# Patient Record
Sex: Female | Born: 1965 | Race: Black or African American | Hispanic: No | Smoking: Never smoker
Health system: Southern US, Community
[De-identification: ages and names within clinical notes are randomized; demographics above are authoritative.]

## PROBLEM LIST (undated history)

## (undated) DIAGNOSIS — I10 Essential (primary) hypertension: Secondary | ICD-10-CM

## (undated) DIAGNOSIS — K219 Gastro-esophageal reflux disease without esophagitis: Secondary | ICD-10-CM

## (undated) DIAGNOSIS — L309 Dermatitis, unspecified: Secondary | ICD-10-CM

## (undated) DIAGNOSIS — E7800 Pure hypercholesterolemia, unspecified: Secondary | ICD-10-CM

## (undated) HISTORY — PX: LITHOTRIPSY: SUR834

## (undated) HISTORY — DX: Essential (primary) hypertension: I10

## (undated) HISTORY — PX: TUBAL LIGATION: SHX77

## (undated) HISTORY — PX: UTERINE FIBROID EMBOLIZATION: SHX825

## (undated) HISTORY — PX: DILATION AND CURETTAGE OF UTERUS: SHX78

---

## 1997-11-29 ENCOUNTER — Emergency Department (HOSPITAL_COMMUNITY): Admission: EM | Admit: 1997-11-29 | Discharge: 1997-11-29 | Payer: Self-pay | Admitting: Family Medicine

## 2000-09-16 ENCOUNTER — Emergency Department (HOSPITAL_COMMUNITY): Admission: EM | Admit: 2000-09-16 | Discharge: 2000-09-16 | Payer: Self-pay | Admitting: *Deleted

## 2002-02-06 ENCOUNTER — Encounter: Payer: Self-pay | Admitting: Family Medicine

## 2002-02-06 ENCOUNTER — Ambulatory Visit (HOSPITAL_COMMUNITY): Admission: RE | Admit: 2002-02-06 | Discharge: 2002-02-06 | Payer: Self-pay | Admitting: Family Medicine

## 2002-03-08 ENCOUNTER — Ambulatory Visit (HOSPITAL_COMMUNITY): Admission: RE | Admit: 2002-03-08 | Discharge: 2002-03-08 | Payer: Self-pay | Admitting: Obstetrics and Gynecology

## 2002-03-08 ENCOUNTER — Encounter: Payer: Self-pay | Admitting: Obstetrics and Gynecology

## 2002-03-21 ENCOUNTER — Ambulatory Visit (HOSPITAL_COMMUNITY): Admission: RE | Admit: 2002-03-21 | Discharge: 2002-03-21 | Payer: Self-pay | Admitting: Interventional Radiology

## 2002-04-09 ENCOUNTER — Observation Stay (HOSPITAL_COMMUNITY): Admission: RE | Admit: 2002-04-09 | Discharge: 2002-04-10 | Payer: Self-pay | Admitting: Interventional Radiology

## 2002-10-13 ENCOUNTER — Emergency Department (HOSPITAL_COMMUNITY): Admission: EM | Admit: 2002-10-13 | Discharge: 2002-10-13 | Payer: Self-pay | Admitting: Emergency Medicine

## 2002-10-13 ENCOUNTER — Encounter: Payer: Self-pay | Admitting: Emergency Medicine

## 2003-08-13 ENCOUNTER — Ambulatory Visit (HOSPITAL_COMMUNITY): Admission: RE | Admit: 2003-08-13 | Discharge: 2003-08-13 | Payer: Self-pay | Admitting: Family Medicine

## 2003-08-16 ENCOUNTER — Encounter: Admission: RE | Admit: 2003-08-16 | Discharge: 2003-08-16 | Payer: Self-pay | Admitting: Family Medicine

## 2004-02-03 ENCOUNTER — Ambulatory Visit: Payer: Self-pay | Admitting: *Deleted

## 2004-03-10 ENCOUNTER — Ambulatory Visit: Payer: Self-pay | Admitting: Family Medicine

## 2004-08-20 ENCOUNTER — Ambulatory Visit: Payer: Self-pay | Admitting: Family Medicine

## 2004-10-14 ENCOUNTER — Ambulatory Visit (HOSPITAL_COMMUNITY): Admission: RE | Admit: 2004-10-14 | Discharge: 2004-10-14 | Payer: Self-pay | Admitting: Family Medicine

## 2005-02-17 DIAGNOSIS — E785 Hyperlipidemia, unspecified: Secondary | ICD-10-CM | POA: Insufficient documentation

## 2005-02-26 ENCOUNTER — Ambulatory Visit: Payer: Self-pay | Admitting: Family Medicine

## 2005-06-18 ENCOUNTER — Ambulatory Visit: Payer: Self-pay | Admitting: Family Medicine

## 2005-07-30 ENCOUNTER — Ambulatory Visit: Payer: Self-pay | Admitting: Family Medicine

## 2005-11-26 ENCOUNTER — Ambulatory Visit: Payer: Self-pay | Admitting: Family Medicine

## 2005-12-23 ENCOUNTER — Ambulatory Visit: Payer: Self-pay | Admitting: Family Medicine

## 2006-01-05 ENCOUNTER — Ambulatory Visit (HOSPITAL_COMMUNITY): Admission: RE | Admit: 2006-01-05 | Discharge: 2006-01-05 | Payer: Self-pay | Admitting: Family Medicine

## 2006-03-28 ENCOUNTER — Ambulatory Visit: Payer: Self-pay | Admitting: Family Medicine

## 2006-07-22 ENCOUNTER — Ambulatory Visit: Payer: Self-pay | Admitting: Family Medicine

## 2006-07-23 ENCOUNTER — Encounter (INDEPENDENT_AMBULATORY_CARE_PROVIDER_SITE_OTHER): Payer: Self-pay | Admitting: Family Medicine

## 2006-07-23 LAB — CONVERTED CEMR LAB
ALT: 16 units/L
AST: 15 units/L
BUN: 13 mg/dL
Cholesterol: 118 mg/dL
Creatinine, Ser: 0.92 mg/dL
Triglycerides: 223 mg/dL

## 2006-08-16 ENCOUNTER — Encounter (INDEPENDENT_AMBULATORY_CARE_PROVIDER_SITE_OTHER): Payer: Self-pay | Admitting: Family Medicine

## 2006-08-16 ENCOUNTER — Ambulatory Visit: Payer: Self-pay | Admitting: Family Medicine

## 2006-08-16 LAB — CONVERTED CEMR LAB: Pap Smear: NORMAL

## 2006-09-03 ENCOUNTER — Encounter (INDEPENDENT_AMBULATORY_CARE_PROVIDER_SITE_OTHER): Payer: Self-pay | Admitting: Family Medicine

## 2006-09-03 DIAGNOSIS — E119 Type 2 diabetes mellitus without complications: Secondary | ICD-10-CM | POA: Insufficient documentation

## 2006-09-03 DIAGNOSIS — R809 Proteinuria, unspecified: Secondary | ICD-10-CM | POA: Insufficient documentation

## 2006-09-03 DIAGNOSIS — I10 Essential (primary) hypertension: Secondary | ICD-10-CM | POA: Insufficient documentation

## 2006-11-08 ENCOUNTER — Ambulatory Visit: Payer: Self-pay | Admitting: Family Medicine

## 2006-12-02 ENCOUNTER — Emergency Department (HOSPITAL_COMMUNITY): Admission: EM | Admit: 2006-12-02 | Discharge: 2006-12-02 | Payer: Self-pay | Admitting: Emergency Medicine

## 2006-12-20 ENCOUNTER — Ambulatory Visit: Payer: Self-pay | Admitting: Family Medicine

## 2006-12-20 LAB — CONVERTED CEMR LAB: Blood Glucose, Fingerstick: 181

## 2007-01-04 ENCOUNTER — Encounter (INDEPENDENT_AMBULATORY_CARE_PROVIDER_SITE_OTHER): Payer: Self-pay | Admitting: *Deleted

## 2007-01-10 ENCOUNTER — Telehealth (INDEPENDENT_AMBULATORY_CARE_PROVIDER_SITE_OTHER): Payer: Self-pay | Admitting: Internal Medicine

## 2007-01-30 ENCOUNTER — Ambulatory Visit (HOSPITAL_COMMUNITY): Admission: RE | Admit: 2007-01-30 | Discharge: 2007-01-30 | Payer: Self-pay | Admitting: Family Medicine

## 2007-02-14 ENCOUNTER — Ambulatory Visit: Payer: Self-pay | Admitting: Family Medicine

## 2007-02-14 LAB — CONVERTED CEMR LAB
Alkaline Phosphatase: 55 units/L (ref 39–117)
Glucose, Bld: 124 mg/dL — ABNORMAL HIGH (ref 70–99)
LDL Cholesterol: 30 mg/dL (ref 0–99)
Sodium: 138 meq/L (ref 135–145)
Total Bilirubin: 0.5 mg/dL (ref 0.3–1.2)
Total Protein: 7.1 g/dL (ref 6.0–8.3)
Triglycerides: 301 mg/dL — ABNORMAL HIGH (ref <150)
VLDL: 60 mg/dL — ABNORMAL HIGH (ref 0–40)

## 2007-05-18 ENCOUNTER — Telehealth (INDEPENDENT_AMBULATORY_CARE_PROVIDER_SITE_OTHER): Payer: Self-pay | Admitting: Family Medicine

## 2007-07-12 ENCOUNTER — Telehealth (INDEPENDENT_AMBULATORY_CARE_PROVIDER_SITE_OTHER): Payer: Self-pay | Admitting: *Deleted

## 2007-07-26 ENCOUNTER — Ambulatory Visit: Payer: Self-pay | Admitting: Family Medicine

## 2007-07-26 LAB — CONVERTED CEMR LAB
ALT: 13 units/L (ref 0–35)
AST: 11 units/L (ref 0–37)
Calcium: 9.2 mg/dL (ref 8.4–10.5)
Chloride: 104 meq/L (ref 96–112)
Creatinine, Ser: 0.97 mg/dL (ref 0.40–1.20)
LDL Cholesterol: 61 mg/dL (ref 0–99)
Total Bilirubin: 0.4 mg/dL (ref 0.3–1.2)
Total CHOL/HDL Ratio: 3.4
VLDL: 24 mg/dL (ref 0–40)

## 2007-08-02 ENCOUNTER — Telehealth (INDEPENDENT_AMBULATORY_CARE_PROVIDER_SITE_OTHER): Payer: Self-pay | Admitting: Family Medicine

## 2007-08-03 ENCOUNTER — Emergency Department (HOSPITAL_COMMUNITY): Admission: EM | Admit: 2007-08-03 | Discharge: 2007-08-03 | Payer: Self-pay | Admitting: Emergency Medicine

## 2007-08-22 ENCOUNTER — Ambulatory Visit: Payer: Self-pay | Admitting: Family Medicine

## 2007-08-22 LAB — CONVERTED CEMR LAB
Blood Glucose, Fingerstick: 109
Hgb A1c MFr Bld: 6.9 %

## 2008-02-01 ENCOUNTER — Ambulatory Visit (HOSPITAL_COMMUNITY): Admission: RE | Admit: 2008-02-01 | Discharge: 2008-02-01 | Payer: Self-pay | Admitting: Family Medicine

## 2008-02-12 ENCOUNTER — Encounter (INDEPENDENT_AMBULATORY_CARE_PROVIDER_SITE_OTHER): Payer: Self-pay | Admitting: Family Medicine

## 2008-02-13 ENCOUNTER — Encounter (INDEPENDENT_AMBULATORY_CARE_PROVIDER_SITE_OTHER): Payer: Self-pay | Admitting: Family Medicine

## 2008-02-14 ENCOUNTER — Encounter: Admission: RE | Admit: 2008-02-14 | Discharge: 2008-02-14 | Payer: Self-pay | Admitting: Family Medicine

## 2008-04-24 ENCOUNTER — Ambulatory Visit: Payer: Self-pay | Admitting: Family Medicine

## 2008-04-24 ENCOUNTER — Encounter (INDEPENDENT_AMBULATORY_CARE_PROVIDER_SITE_OTHER): Payer: Self-pay | Admitting: Family Medicine

## 2008-04-24 DIAGNOSIS — L259 Unspecified contact dermatitis, unspecified cause: Secondary | ICD-10-CM | POA: Insufficient documentation

## 2008-04-24 LAB — CONVERTED CEMR LAB
Alkaline Phosphatase: 61 units/L (ref 39–117)
Basophils Absolute: 0 10*3/uL (ref 0.0–0.1)
Blood in Urine, dipstick: NEGATIVE
Creatinine, Ser: 1.01 mg/dL (ref 0.40–1.20)
Eosinophils Absolute: 0.4 10*3/uL (ref 0.0–0.7)
Eosinophils Relative: 7 % — ABNORMAL HIGH (ref 0–5)
Glucose, Bld: 131 mg/dL — ABNORMAL HIGH (ref 70–99)
HCT: 36.4 % (ref 36.0–46.0)
LDL Cholesterol: 70 mg/dL (ref 0–99)
MCV: 97.8 fL (ref 78.0–100.0)
Monocytes Absolute: 0.6 10*3/uL (ref 0.1–1.0)
Nitrite: NEGATIVE
Platelets: 336 10*3/uL (ref 150–400)
Protein, U semiquant: 100
RDW: 15.6 % — ABNORMAL HIGH (ref 11.5–15.5)
Sodium: 140 meq/L (ref 135–145)
Total Bilirubin: 0.6 mg/dL (ref 0.3–1.2)
Total CHOL/HDL Ratio: 4.3
Total Protein: 7 g/dL (ref 6.0–8.3)
Triglycerides: 107 mg/dL (ref <150)
Urobilinogen, UA: 1
VLDL: 21 mg/dL (ref 0–40)
WBC Urine, dipstick: NEGATIVE

## 2008-06-11 ENCOUNTER — Encounter (INDEPENDENT_AMBULATORY_CARE_PROVIDER_SITE_OTHER): Payer: Self-pay | Admitting: Family Medicine

## 2009-01-23 ENCOUNTER — Telehealth: Payer: Self-pay | Admitting: Physician Assistant

## 2009-02-14 ENCOUNTER — Ambulatory Visit (HOSPITAL_COMMUNITY): Admission: RE | Admit: 2009-02-14 | Discharge: 2009-02-14 | Payer: Self-pay | Admitting: Internal Medicine

## 2009-03-11 ENCOUNTER — Ambulatory Visit: Payer: Self-pay | Admitting: Physician Assistant

## 2009-03-11 LAB — CONVERTED CEMR LAB
HDL goal, serum: 40 mg/dL
Hgb A1c MFr Bld: 6.7 %

## 2009-03-26 ENCOUNTER — Encounter (INDEPENDENT_AMBULATORY_CARE_PROVIDER_SITE_OTHER): Payer: Self-pay | Admitting: *Deleted

## 2009-03-26 LAB — CONVERTED CEMR LAB
ALT: 12 units/L (ref 0–35)
Albumin: 4.6 g/dL (ref 3.5–5.2)
CO2: 23 meq/L (ref 19–32)
Calcium: 10.1 mg/dL (ref 8.4–10.5)
Chloride: 102 meq/L (ref 96–112)
Cholesterol: 129 mg/dL (ref 0–200)
Glucose, Bld: 123 mg/dL — ABNORMAL HIGH (ref 70–99)
Microalb, Ur: 0.73 mg/dL (ref 0.00–1.89)
Potassium: 4.4 meq/L (ref 3.5–5.3)
Sodium: 140 meq/L (ref 135–145)
Total Bilirubin: 1 mg/dL (ref 0.3–1.2)
Total Protein: 7.9 g/dL (ref 6.0–8.3)
Triglycerides: 159 mg/dL — ABNORMAL HIGH (ref <150)

## 2009-04-02 ENCOUNTER — Telehealth: Payer: Self-pay | Admitting: Physician Assistant

## 2009-08-21 ENCOUNTER — Ambulatory Visit: Payer: Self-pay | Admitting: Physician Assistant

## 2009-08-21 DIAGNOSIS — A630 Anogenital (venereal) warts: Secondary | ICD-10-CM | POA: Insufficient documentation

## 2009-08-21 DIAGNOSIS — F4321 Adjustment disorder with depressed mood: Secondary | ICD-10-CM | POA: Insufficient documentation

## 2009-08-21 LAB — CONVERTED CEMR LAB
Bilirubin Urine: NEGATIVE
Blood Glucose, Fingerstick: 80
Glucose, Urine, Semiquant: NEGATIVE
Hgb A1c MFr Bld: 6.5 %
Ketones, urine, test strip: NEGATIVE
Rapid HIV Screen: NEGATIVE
Specific Gravity, Urine: >=1.03
Urobilinogen, UA: 1
Whiff Test: NEGATIVE
pH: 5

## 2009-08-22 LAB — CONVERTED CEMR LAB
Alkaline Phosphatase: 66 units/L (ref 39–117)
Basophils Absolute: 0 10*3/uL (ref 0.0–0.1)
Basophils Relative: 0 % (ref 0–1)
CO2: 22 meq/L (ref 19–32)
Chlamydia, DNA Probe: NEGATIVE
Cholesterol: 98 mg/dL (ref 0–200)
Creatinine, Ser: 0.93 mg/dL (ref 0.40–1.20)
Eosinophils Absolute: 0.4 10*3/uL (ref 0.0–0.7)
Glucose, Bld: 102 mg/dL — ABNORMAL HIGH (ref 70–99)
HDL: 31 mg/dL — ABNORMAL LOW (ref >39)
LDL Cholesterol: 45 mg/dL (ref 0–99)
MCHC: 31.7 g/dL (ref 30.0–36.0)
MCV: 95.3 fL (ref 78.0–100.0)
Monocytes Absolute: 0.4 10*3/uL (ref 0.1–1.0)
Monocytes Relative: 6 % (ref 3–12)
Neutro Abs: 4.8 10*3/uL (ref 1.7–7.7)
Neutrophils Relative %: 62 % (ref 43–77)
RBC: 3.81 M/uL — ABNORMAL LOW (ref 3.87–5.11)
RDW: 16 % — ABNORMAL HIGH (ref 11.5–15.5)
Total Bilirubin: 0.5 mg/dL (ref 0.3–1.2)
Total CHOL/HDL Ratio: 3.2
Triglycerides: 109 mg/dL (ref <150)
VLDL: 22 mg/dL (ref 0–40)

## 2009-08-25 ENCOUNTER — Encounter: Payer: Self-pay | Admitting: Physician Assistant

## 2009-08-25 ENCOUNTER — Ambulatory Visit: Payer: Self-pay | Admitting: Internal Medicine

## 2009-08-27 ENCOUNTER — Encounter: Payer: Self-pay | Admitting: Physician Assistant

## 2009-08-27 DIAGNOSIS — D649 Anemia, unspecified: Secondary | ICD-10-CM | POA: Insufficient documentation

## 2009-08-27 LAB — CONVERTED CEMR LAB
Ferritin: 99 ng/mL (ref 10–291)
Folate: 7.4 ng/mL
Saturation Ratios: 16 % — ABNORMAL LOW (ref 20–55)
TIBC: 344 ug/dL (ref 250–470)
Vitamin B-12: 637 pg/mL (ref 211–911)

## 2009-08-28 ENCOUNTER — Encounter (INDEPENDENT_AMBULATORY_CARE_PROVIDER_SITE_OTHER): Payer: Self-pay | Admitting: *Deleted

## 2009-09-08 ENCOUNTER — Encounter: Payer: Self-pay | Admitting: Physician Assistant

## 2009-09-17 ENCOUNTER — Encounter (INDEPENDENT_AMBULATORY_CARE_PROVIDER_SITE_OTHER): Payer: Self-pay | Admitting: *Deleted

## 2009-10-14 ENCOUNTER — Ambulatory Visit: Payer: Self-pay | Admitting: Internal Medicine

## 2009-10-14 ENCOUNTER — Encounter: Payer: Self-pay | Admitting: Physician Assistant

## 2009-11-06 ENCOUNTER — Emergency Department (HOSPITAL_COMMUNITY): Admission: EM | Admit: 2009-11-06 | Discharge: 2009-11-06 | Payer: Self-pay | Admitting: Emergency Medicine

## 2009-12-25 ENCOUNTER — Ambulatory Visit: Payer: Self-pay | Admitting: Internal Medicine

## 2009-12-25 ENCOUNTER — Encounter: Payer: Self-pay | Admitting: Physician Assistant

## 2009-12-25 DIAGNOSIS — R609 Edema, unspecified: Secondary | ICD-10-CM | POA: Insufficient documentation

## 2009-12-26 ENCOUNTER — Encounter: Payer: Self-pay | Admitting: Physician Assistant

## 2009-12-26 LAB — CONVERTED CEMR LAB
BUN: 11 mg/dL (ref 6–23)
Basophils Absolute: 0 10*3/uL (ref 0.0–0.1)
Basophils Relative: 0 % (ref 0–1)
Eosinophils Absolute: 0.3 10*3/uL (ref 0.0–0.7)
Hgb A1c MFr Bld: 6.6 % — ABNORMAL HIGH (ref <5.7)
MCHC: 32.3 g/dL (ref 30.0–36.0)
MCV: 93.6 fL (ref 78.0–100.0)
Monocytes Relative: 5 % (ref 3–12)
Neutrophils Relative %: 62 % (ref 43–77)
Platelets: 358 10*3/uL (ref 150–400)
Potassium: 3.9 meq/L (ref 3.5–5.3)
RBC: 4.2 M/uL (ref 3.87–5.11)
RDW: 15.2 % (ref 11.5–15.5)

## 2010-02-17 ENCOUNTER — Ambulatory Visit (HOSPITAL_COMMUNITY)
Admission: RE | Admit: 2010-02-17 | Discharge: 2010-02-17 | Payer: Self-pay | Source: Home / Self Care | Admitting: Internal Medicine

## 2010-02-19 ENCOUNTER — Telehealth (INDEPENDENT_AMBULATORY_CARE_PROVIDER_SITE_OTHER): Payer: Self-pay | Admitting: *Deleted

## 2010-02-23 ENCOUNTER — Encounter: Payer: Self-pay | Admitting: Physician Assistant

## 2010-03-02 ENCOUNTER — Encounter: Admission: RE | Admit: 2010-03-02 | Discharge: 2010-03-02 | Payer: Self-pay | Admitting: Internal Medicine

## 2010-05-05 ENCOUNTER — Ambulatory Visit: Admit: 2010-05-05 | Payer: Self-pay | Admitting: Nurse Practitioner

## 2010-05-09 ENCOUNTER — Encounter: Payer: Self-pay | Admitting: Internal Medicine

## 2010-05-10 ENCOUNTER — Encounter: Payer: Self-pay | Admitting: Family Medicine

## 2010-05-19 NOTE — Letter (Signed)
Summary: *HSN Results Follow up  [facility]  [address] [place], [place] [address]   Phone: [phone]  Fax: [phone]      12/26/2009   [facility] [address] [place], [place]  [address]   Dear  Ms. Jenny Colon,                            ____S.Drinkard,FNP   ____D. Gore,FNP       ____B. McPherson,MD   ____V. Rankins,MD    ____E. Mulberry,MD    ____N. Daphine Deutscher, FNP  ____D. Reche Dixon, MD    ____K. Philipp Deputy, MD    __x__S. Alben Spittle, PA-C     This letter is to inform you that your recent test(s):  _______Pap Smear    ___x____Lab Test     _______X-ray    ___x____ is within acceptable limits  _______ requires a medication change  _______ requires a follow-up lab visit  _______ requires a follow-up visit with your provider   Comments:  Blood counts ok.  Kidney function is good.  Hemoglobin A1C 6.6 which is great!  Keep up the good work.       _________________________________________________________ If you have any questions, please contact our office                     Sincerely,  Tereso Newcomer PA-C [facility]

## 2010-05-19 NOTE — Progress Notes (Signed)
Summary: Letter  Phone Note Outgoing Call   Summary of Call: Form signed for [employer] pt and see if she wants it faxed back or will come pick up Need fax # if she wants if faxted back Initial call taken by: Lehman Prom FNP,  February 19, 2010 5:50 PM  Follow-up for Phone Call        CALLED PT LEFT MESSAGE /LETTER IS READY FOR PICK UP Follow-up by: Arta Bruce,  February 20, 2010 11:17 AM

## 2010-05-19 NOTE — Miscellaneous (Signed)
Summary: Retasure Normal   Clinical Lists Changes  Observations: Added new observation of DIAB EYE EX: Retasure Normal (08/28/2009 22:59)

## 2010-05-19 NOTE — Assessment & Plan Note (Signed)
Summary: SCHE IN 4 MONTHS WITH SCOTT FOR DM AND HYPERTENSION//LR   Vital Signs:  Patient profile:   45 year old female Menstrual status:  regular Height:      62.50 inches Weight:      212.4 pounds Temp:     97.9 degrees F oral Pulse rate:   72 / minute Pulse rhythm:   regular Resp:     16 per minute BP sitting:   106 / 70  (right arm) Cuff size:   large  Vitals Entered By: Michelle Nasuti (December 25, 2009 4:03 PM) CC: dm and htn follow up, Hypertension Management   Primary Care Provider:  Tereso Newcomer PA-C  CC:  dm and htn follow up and Hypertension Management.  History of Present Illness: DM:  Sugars 90-140.  No foot problems.  No polyuria or polydipsia.  No nausea or abd pain.  Has some ? swelling in left leg.  Started when she started exercising a few mos. ago.  No injury. No recent trips.  No cp.  No syncope.  No hemoptysis.  Does not go down when she lies flat.  Does favor left side and pushes more with right when exercising.   Hypertension History:      She denies headache, dyspnea with exertion, and syncope.  She notes no problems with any antihypertensive medication side effects.        Positive major cardiovascular risk factors include diabetes, hyperlipidemia, and hypertension.  Negative major cardiovascular risk factors include female age less than 65 years old, negative family history for ischemic heart disease, and non-tobacco-Rosia Syme status.        Further assessment for target organ damage reveals no history of ASHD, stroke/TIA, or peripheral vascular disease.     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 Pcn  Physical Exam  General:  alert, well-developed, and well-nourished.   Head:  normocephalic and atraumatic.   Eyes:  pupils equal, pupils round, pupils reactive to light, and no optic disk abnormalities.   Neck:  supple.   Lungs:  normal breath sounds, no crackles, and no wheezes.   Heart:  normal rate and regular rhythm.   Abdomen:  soft, non-tender, and no hepatomegaly.   Extremities:  slight trace of edema on left none on right  Neurologic:  alert & oriented X3 and cranial nerves II-XII intact.   Psych:  normally interactive.     Impression & Recommendations:  Problem # 1:  DIABETES MELLITUS, TYPE II (ICD-250.00) sounds controlled check A1C  The following medications were removed from the medication list:    Sg Enteric Coated Aspirin 325 Mg Tbec (Aspirin) ..... One by mouth 1 hour prior to simcor Her updated medication list for this problem includes:    Metformin Hcl 500 Mg Tabs (Metformin hcl) ..... One by mouth two times a day    Lisinopril 40 Mg Tabs (Lisinopril) .Marland Kitchen... Take 1 tablet by mouth once a day    Glucotrol Xl 5 Mg Tb24 (Glipizide) ..... One by mouth q am    Micardis  Hct 80-25 Mg Tabs (Telmisartan-hctz) ..... One by mouth q am    Actos 15 Mg Tabs (Pioglitazone hcl) .Marland Kitchen... 1 tablet by mouth each am  Orders: Capillary Blood Glucose/CBG (82948) T- Hemoglobin A1C ([address])  Problem # 2:  HYPERTENSION (ICD-401.9) controlled  Her updated medication list for this problem includes:    Lisinopril 40 Mg Tabs (Lisinopril) .Marland Kitchen... Take 1 tablet by mouth once a day    Micardis Hct 80-25 Mg Tabs (Telmisartan-hctz) ..... One by mouth q am  Orders: T-Basic Metabolic Panel [phone])  Problem # 3:  EDEMA (ICD-782.3) may be related to the way she exercises f/u if any changes do not think she needs any special testing at this time  Her updated medication list  for this problem includes:    Micardis Hct 80-25 Mg Tabs (Telmisartan-hctz) ..... One by mouth q am  Problem # 4:  ANEMIA (ICD-285.9) on iron  Her updated medication list for this problem includes:    Ferrous Sulfate 325 (65 Fe) Mg Tabs (Ferrous sulfate) .Marland Kitchen... Take 1 tablet by mouth once a day  Orders: T-CBC w/Diff ([address])  Complete Medication List: 1)  Metformin Hcl 500 Mg Tabs (Metformin hcl) .... One by mouth two times a day 2)  Lisinopril 40 Mg Tabs (Lisinopril) .... Take 1 tablet by mouth once a day 3)  Glucotrol Xl 5 Mg Tb24 (Glipizide) .... One by mouth q am 4)  Micardis Hct 80-25 Mg Tabs (Telmisartan-hctz) .... One by mouth q am 5)  Actos 15 Mg Tabs (Pioglitazone hcl) .Marland Kitchen.. 1 tablet by mouth each am 6)  Niaspan 1000 Mg Tbcr (Niacin (antihyperlipidemic)) .... Take 1 tablet by mouth once a day for cholesterol.also should be on lipitor 7)  Crestor 10 Mg Tabs (Rosuvastatin calcium) .... Take 1 tablet by mouth once a day 8)  Ferrous Sulfate 325 (65 Fe) Mg Tabs (Ferrous sulfate) .... Take 1 tablet by mouth once a day  Hypertension Assessment/Plan:      The patient's hypertensive risk group is category C: Target organ damage and/or diabetes.  Her calculated 10 year risk of coronary heart disease is 8 %.  Today's blood pressure is 106/70.  Her blood pressure goal is < 130/80.  Patient Instructions: 1)  Please schedule a follow-up appointment in 4 months with Scott for blood pressure and diabetes.

## 2010-05-19 NOTE — Letter (Signed)
Summary: *HSN Results Follow up  [facility]  [address] [place], [place] [address]   Phone: [phone]  Fax: [phone]      08/25/2009   [facility] [address] [place], [place]  [address]   Dear  Ms. Monseratt Brassfield,                           ____S.Drinkard,FNP   ____D. Gore,FNP       ____B. McPherson,MD   ____V. Rankins,MD    ____E. Mulberry,MD    ____N. Daphine Deutscher, FNP  ____D. Reche Dixon, MD    ____K. Philipp Deputy, MD    __x__S. Alben Spittle, PA-C   This letter is to inform you that your recent test(s):  __x_____Pap Smear    ___x____Lab Test     _______X-ray    _______ is within acceptable limits  _______ requires a medication change  _______ requires a follow-up lab visit  _______ requires a follow-up visit with your provider   Comments:    Your pap smear was normal.    Your labs were ok excpet that you are slightly anemic.  We are trying to check your iron levels with the blood that was drawn.  If this is not possible, we will need you to come back in for another blood draw to check this.       _________________________________________________________ If you have any questions, please contact our office                     Sincerely,  Tereso Newcomer PA-C [facility]

## 2010-05-19 NOTE — Assessment & Plan Note (Signed)
Summary: cpp per Lorin Picket /tmm   Vital Signs:  Patient profile:   45 year old female Menstrual status:  regular Height:      62.50 inches Weight:      226 pounds BMI:     40.82 Temp:     97.9 degrees F oral Pulse rate:   71 / minute Pulse rhythm:   regular Resp:     18 per minute BP sitting:   107 / 75  (left arm) Cuff size:   large  Vitals Entered By: [place] Shannon (Aug 21, 2009 9:02 AM) CC: CPE, Hypertension Management Is Patient Diabetic? Yes Pain Assessment Patient in pain? no      CBG Result 80  Does patient need assistance? Functional Status Self care Ambulation Normal   Primary Care Provider:  Tereso Newcomer PA-C  CC:  CPE and Hypertension Management.  History of Present Illness: Here for CPP.  Does have h/o abnormal pap.  Had colposcopy and some other procedure many years ago. Does have a h/o genital warts.  Does have a "bump" in vaginal area.  Dr. Barbaraann Barthel has looked at and Eligha Bridegroom told it was not a wart but scar tissue. Period regular.  Does have a h/o fibroids.  Had uterine ablation in past.  No cramps.  No heavy periods. No abnormal bleeding, d/c or odor. Had mammo in 2009/01/30. No FHx of breast, ovarian or colon cancer. Not taking calcium. PHQ9=7 today.  Aunt passed away in 31-Jan-2023.  Feels down about this.  No h/o medicine for depression.  No FHx of depression.  Worried about her weight.  She has joined a gym.  Losing weight.    Hypertension History:      Positive major cardiovascular risk factors include diabetes, hyperlipidemia, and hypertension.  Negative major cardiovascular risk factors include female age less than 56 years old, negative family history for ischemic heart disease, and non-tobacco-user status.        Further assessment for target organ damage reveals no history of ASHD, stroke/TIA, or peripheral vascular disease.     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 Pcn  Past History:  Past Medical History: Last updated: 03/11/2009 Diabetes mellitus, type II (11/06/02) Hypertension (02/el99) Elevated triglcerides, Decreased HDL Hyperlipidemia (02/17/2005) Proteinuria(since age 9).  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 Mother died at age 80 of diabetes mellitus and "a heart murmur" Father unknown maternal aunts, several with diabetes maternal grandmother with End stage renal disease  Social History: Never Smoked Alcohol use-no Drug use-no Single Drug Use:  never  Review of Systems      See HPI  General:  Denies chills and fever. CV:  Denies chest pain or discomfort, fainting, and shortness of breath with exertion. Resp:  Denies cough. GI:  Denies bloody stools and dark tarry stools. GU:  Denies hematuria. Psych:  Denies suicidal thoughts/plans. Endo:  Complains of cold intolerance.  Physical Exam  General:  alert, well-developed, and well-nourished.   Head:  normocephalic and atraumatic.   Eyes:   pupils equal, pupils round, pupils reactive to light, and no retinal abnormalitiies.   Ears:  R ear normal and L ear normal.   Nose:  no external deformity.   Mouth:  pharynx pink and moist, no erythema, and no exudates.   Neck:  supple, no thyromegaly, and no cervical lymphadenopathy.   Breasts:  skin/areolae normal, no masses, no abnormal thickening, no nipple discharge, no tenderness, and no adenopathy.   Lungs:  normal breath sounds, no crackles, and no wheezes.   Heart:  normal rate, regular rhythm, and no murmur.   Abdomen:  soft, non-tender, and normal bowel sounds.   Rectal:  no hemorrhoids.   Genitalia:  normal introitus, no vaginal discharge, mucosa pink and moist, no vaginal or cervical lesions, no adnexal masses or tenderness, and conlylomata noted most post aspect of right labia; left buttock close to rectum.   fundus difficult to palpate with body habitus Msk:  normal ROM.   Extremities:  trace left pedal edema and trace right pedal edema.   Neurologic:  alert & oriented X3, cranial nerves II-XII intact, strength normal in all extremities, and DTRs symmetrical and normal.   Skin:  vitiligo.   Psych:  normally interactive and good eye contact.     Impression & Recommendations:  Problem # 1:  ADJUSTMENT DISORDER WITH DEPRESSED MOOD (ICD-309.0)  discussed referral to LCSW she would like to see Marchelle Folks  Orders: Psychology Referral (Psychology)  Problem # 2:  SCREENING FOR MALIGNANT NEOPLASM, CERVIX (ICD-V76.2)  Orders: KOH/ WET [place] [phone]) T- GC Chlamydia ([address]) T-Pap Smear, Thin Prep ([address])  Problem # 3:  EXAMINATION, ROUTINE MEDICAL (ICD-V70.0)  Orders: T-CBC w/Diff ([address]78-29562) T-TSH [phone]) T-Syphilis Test (RPR) [phone]) KOH/ WET [place] [phone]) T- GC Chlamydia ([address]25) UA Dipstick w/o Micro (manual) (81002) T-Pap Smear, Thin Prep (36644)  Problem # 4:  HYPERLIPIDEMIA (ICD-272.4) tolerating Crestor without mylagias  Her updated medication  list for this problem includes:    Niaspan 1000 Mg Tbcr (Niacin (antihyperlipidemic)) .Marland Kitchen... Take 1 tablet by mouth once a day for cholesterol.also should be on lipitor    Crestor 10 Mg Tabs (Rosuvastatin calcium) .Marland Kitchen... Take 1 tablet by mouth once a day  Orders: T-Lipid Profile ([id number]) T-Comprehensive Metabolic Panel ([address]-43329)  Problem # 5:  PROTEINURIA (ICD-791.0) no protein in urine today  Orders: UA Dipstick w/o Micro (manual) (51884)  Problem # 6:  HYPERTENSION (ICD-401.9) optimal  Her updated medication list for this problem includes:    Monopril 40 Mg Tabs (Fosinopril sodium) .Marland Kitchen... 1 tab by mouth daily    Micardis Hct 80-25 Mg Tabs (Telmisartan-hctz) ..... One by mouth q am  Orders: T-Comprehensive Metabolic Panel ([address]60) UA Dipstick w/o Micro (manual) ([address])  Problem # 7:  DIABETES MELLITUS, TYPE II (ICD-250.00) controlled  Her updated medication list for this problem includes:    Metformin Hcl 500 Mg Tabs (Metformin hcl) ..... One by mouth two times a day    Monopril 40 Mg Tabs (Fosinopril sodium) .Marland Kitchen... 1 tab by mouth daily    Glucotrol Xl 5 Mg Tb24 (Glipizide) ..... One by mouth q am  Micardis Hct 80-25 Mg Tabs (Telmisartan-hctz) ..... One by mouth q am    Sg Enteric Coated Aspirin 325 Mg Tbec (Aspirin) ..... One by mouth 1 hour prior to simcor    Actos 15 Mg Tabs (Pioglitazone hcl) .Marland Kitchen... 1 tablet by mouth each am  Orders: T-Comprehensive Metabolic Panel ([id number])  Problem # 8:  CONDYLOMA ACUMINATUM (ICD-078.11)  refer to GYN clinic for treatment  Orders: Gynecologic Referral (Gyn)  Complete Medication List: 1)  Metformin Hcl 500 Mg Tabs (Metformin hcl) .... One by mouth two times a day 2)  Monopril 40 Mg Tabs (Fosinopril sodium) .Marland Kitchen.. 1 tab by mouth daily 3)  Glucotrol Xl 5 Mg Tb24 (Glipizide) .... One by mouth q am 4)  Micardis Hct 80-25 Mg Tabs (Telmisartan-hctz) .... One by mouth q am 5)  Sg Enteric Coated Aspirin 325 Mg Tbec  (Aspirin) .... One by mouth 1 hour prior to simcor 6)  Actos 15 Mg Tabs (Pioglitazone hcl) .Marland Kitchen.. 1 tablet by mouth each am 7)  Niaspan 1000 Mg Tbcr (Niacin (antihyperlipidemic)) .... Take 1 tablet by mouth once a day for cholesterol.also should be on lipitor 8)  Crestor 10 Mg Tabs (Rosuvastatin calcium) .... Take 1 tablet by mouth once a day  Hypertension Assessment/Plan:      The patient's hypertensive risk group is category C: Target organ damage and/or diabetes.  Her calculated 10 year risk of coronary heart disease is 3 %.  Today's blood pressure is 107/75.  Her blood pressure goal is < 130/80.  Patient Instructions: 1)  Schedule an appointment with Ethelene Browns. 2)  Schedule Retasure at [facility]. 3)  Schedule appointment with GYN clinic on [facility]. 4)  Please schedule a follow-up appointment in 4 months with Kanen Mottola for diabetes and hypertension.   Laboratory Results   Urine Tests  Date/Time Received: Aug 21, 2009 9:25 AM   Routine Urinalysis   Glucose: negative   (Normal Range: Negative) Bilirubin: negative   (Normal Range: Negative) Ketone: negative   (Normal Range: Negative) Spec. Gravity: >=1.030   (Normal Range: 1.003-1.035) Blood: negative   (Normal Range: Negative) pH: 5.0   (Normal Range: 5.0-8.0) Protein: negative   (Normal Range: Negative) Urobilinogen: 1.0   (Normal Range: 0-1) Nitrite: negative   (Normal Range: Negative) Leukocyte Esterace: negative   (Normal Range: Negative)     Blood Tests   Date/Time Received: Aug 21, 2009 9:25 AM   HGBA1C: 6.5%   (Normal Range: Non-Diabetic - 3-6%   Control Diabetic - 6-8%) CBG Random:: 80mg /dL  Date/Time Received: Aug 21, 2009 9:26 AM   [employer] Source: vaginal WBC/hpf: 1-5 Bacteria/hpf: rare Clue cells/hpf: moderate  Negative whiff Yeast/hpf: none Wet [place] KOH: Negative Trichomonas/hpf: none  Other Tests  Rapid HIV: negative

## 2010-05-19 NOTE — Letter (Signed)
Summary: *HSN Results Follow up  [facility]  [address] [place], [place] [address]   Phone: [phone]  Fax: [phone]      08/28/2009   [facility] [place] [address] [place], [place]  [address]   Dear  Ms. Jenny Colon,                            ____S.Drinkard,FNP   ____D. Gore,FNP       ____B. McPherson,MD   ____V. Rankins,MD    ____E. Mulberry,MD    ____N. Daphine Deutscher, FNP  ____D. Reche Dixon, MD    ____K. Philipp Deputy, MD    ____Other     This letter is to inform you that your recent test(s):  _______Pap Smear    ___X____Lab Test     _______X-ray    _______ is within acceptable limits  ___X____ requires a medication change  _______ requires a follow-up lab visit  _______ requires a follow-up visit with your provider   Comments:  We have been trying to reach you.  Please give [place] a call at your earliest convenience.       _________________________________________________________ If you have any questions, please contact our office                     Sincerely,  [place] Shannon [facility]

## 2010-05-19 NOTE — Letter (Signed)
Summary: GYN CLINIC  GYN CLINIC   Imported By: Arta Bruce 10/21/2009 12:05:26  _____________________________________________________________________  External Attachment:    Type:   Image     Comment:   External Document

## 2010-05-19 NOTE — Letter (Signed)
Summary: *HSN Results Follow up  [facility]  [address] [place], [place] [address]   Phone: [phone]  Fax: [phone]      09/08/2009   [facility] D [place] [address] [place], [place]  [address]   Dear  Ms. Alfred Bergsma,                            ____S.Drinkard,FNP   ____D. Gore,FNP       ____B. McPherson,MD   ____V. Rankins,MD    ____E. Mulberry,MD    ____N. Daphine Deutscher, FNP  ____D. Reche Dixon, MD    ____K. Philipp Deputy, MD    __x__S. Alben Spittle, PA-C     This letter is to inform you that your recent test(s):  _______Pap Smear    _______Lab Test     _______X-ray    _______ is within acceptable limits  _______ requires a medication change  _______ requires a follow-up lab visit  _______ requires a follow-up visit with your provider   Comments: Your eye test done in May was normal.       _________________________________________________________ If you have any questions, please contact our office                     Sincerely,  Tereso Newcomer PA-C [facility]

## 2010-05-22 NOTE — Progress Notes (Signed)
Summary: Office Visit//DEPRESSION SCREENING  Office Visit//DEPRESSION SCREENING   Imported By: Arta Bruce 10/17/2009 12:57:36  _____________________________________________________________________  External Attachment:    Type:   Image     Comment:   External Document

## 2010-05-22 NOTE — Letter (Signed)
Summary: RETASURE  RETASURE   Imported By: Arta Bruce 09/09/2009 10:13:31  _____________________________________________________________________  External Attachment:    Type:   Image     Comment:   External Document

## 2010-06-19 ENCOUNTER — Encounter (INDEPENDENT_AMBULATORY_CARE_PROVIDER_SITE_OTHER): Payer: Self-pay | Admitting: Nurse Practitioner

## 2010-06-19 ENCOUNTER — Encounter: Payer: Self-pay | Admitting: Nurse Practitioner

## 2010-06-19 DIAGNOSIS — E669 Obesity, unspecified: Secondary | ICD-10-CM | POA: Insufficient documentation

## 2010-06-19 DIAGNOSIS — D179 Benign lipomatous neoplasm, unspecified: Secondary | ICD-10-CM | POA: Insufficient documentation

## 2010-06-19 LAB — CONVERTED CEMR LAB
Blood in Urine, dipstick: NEGATIVE
Microalb, Ur: 0.98 mg/dL (ref 0.00–1.89)
Nitrite: NEGATIVE
Urobilinogen, UA: 1

## 2010-06-25 NOTE — Assessment & Plan Note (Signed)
Summary: Diabetes   Vital Signs:  Patient profile:   45 year old female Menstrual status:  regular LMP:     05/18/2010 Weight:      213.6 pounds BMI:     38.58 Temp:     98.1 degrees F oral Pulse rate:   81 / minute Pulse rhythm:   regular Resp:     20 per minute BP sitting:   116 / 82  (left arm) Cuff size:   large  Vitals Entered By: Levon Hedger (June 19, 2010 9:58 AM)  Nutrition Counseling: Patient's BMI is greater than 25 and therefore counseled on weight management options. CC: follow-up visit DM...lump under left arm, Hypertension Management, Lipid Management Is Patient Diabetic? Yes Pain Assessment Patient in pain? yes     Location: arm, leg Intensity: 3 CBG Result 130  Does patient need assistance? Functional Status Self care Ambulation Normal LMP (date): 05/18/2010 LMP - Character: normal LMP - Reliable? Yes     Enter LMP: 05/18/2010 Last PAP Result NEGATIVE FOR INTRAEPITHELIAL LESIONS OR MALIGNANCY.   Primary Care Provider:  Tereso Newcomer PA-C  CC:  follow-up visit DM...lump under left arm, Hypertension Management, and Lipid Management.  History of Present Illness:  Pt into the office for f/u on diabetes and blood pressure. Pt did NOT bring her medications with her to the office today.  Lump under left axilla - she had mammogram in 02/2010 and she was told to have mammogram every 6 months instead of 1 year due to calcifications. She has had "lumpy" breasts forthe past 10 years.  Weight - lost about 15 pounds in the last 6 months  Diabetes Management History:      The patient is a 45 years old female who comes in for evaluation of DM Type 2.  She has not been enrolled in the "Diabetic Education Program".  She states understanding of dietary principles and is following her diet appropriately.  No sensory loss is reported.  Self foot exams are not being performed.  She is checking home blood sugars.  She says that she is not exercising regularly.          Hypoglycemic symptoms are not occurring.  No hyperglycemic symptoms are reported.  Other comments include: pt is checking her blood sugar daily.        No changes have been made to her treatment plan since last visit.    Hypertension History:      She denies headache, chest pain, and palpitations.  She notes no problems with any antihypertensive medication side effects.  Pt is taking medications as ordered.  Marland Kitchen        Positive major cardiovascular risk factors include diabetes, hyperlipidemia, and hypertension.  Negative major cardiovascular risk factors include female age less than 51 years old, negative family history for ischemic heart disease, and non-tobacco-user status.        Further assessment for target organ damage reveals no history of ASHD, cardiac end-organ damage (CHF/LVH), stroke/TIA, peripheral vascular disease, renal insufficiency, or hypertensive retinopathy.    Lipid Management History:      Positive NCEP/ATP III risk factors include diabetes, HDL cholesterol less than 40, and hypertension.  Negative NCEP/ATP III risk factors include female age less than 46 years old, no history of early menopause without estrogen hormone replacement, no family history for ischemic heart disease, non-tobacco-user status, no ASHD (atherosclerotic heart disease), no prior stroke/TIA, no peripheral vascular disease, and no history of aortic aneurysm.  The patient states that she does not know about the "Therapeutic Lifestyle Change" diet.  The patient does not know about adjunctive measures for cholesterol lowering.  Comments include: pt is taking crestor as ordered.     Habits & Providers  Alcohol-Tobacco-Diet     Alcohol drinks/day: 0     Tobacco Status: never  Exercise-Depression-Behavior     Does Patient Exercise: no     Exercise Counseling: to improve exercise regimen     Have you felt down or hopeless? no     Have you felt little pleasure in things? no     Depression  Counseling: not indicated; screening negative for depression     Drug Use: never  Comments: Pt is a member of [employer] and has restarted her exercise routine  Allergies (verified): 1)  ! Pcn  Social History: Does Patient Exercise:  no  Review of Systems General:  Denies fever. CV:  Denies chest pain or discomfort. Resp:  Denies cough. GI:  Denies abdominal pain, nausea, and vomiting. Derm:  ? lump in left breast.  Physical Exam  General:  alert.   Head:  normocephalic.   Lungs:  normal breath sounds.   Heart:  normal rate and regular rhythm.   Abdomen:  normal bowel sounds.   Neurologic:  alert & oriented X3.   Skin:  left axilla - lipoma noted Psych:  Oriented X3.    Diabetes Management Exam:    Foot Exam (with socks and/or shoes not present):       Sensory-Monofilament:          Left foot: normal          Right foot: normal       Nails:          Left foot: normal          Right foot: normal   Impression & Recommendations:  Problem # 1:  HYPERTENSION (ICD-401.9) BP is doing well.  continue current meds - DASH diet Her updated medication list for this problem includes:    Lisinopril 40 Mg Tabs (Lisinopril) .Marland Kitchen... Take 1 tablet by mouth once a day    Micardis Hct 80-25 Mg Tabs (Telmisartan-hctz) ..... One by mouth q am  Problem # 2:  DIABETES MELLITUS, TYPE II (ICD-250.00) Hbga1c=7 Pt is doing well - continue current meds Her updated medication list for this problem includes:    Metformin Hcl 500 Mg Tabs (Metformin hcl) ..... One by mouth two times a day    Lisinopril 40 Mg Tabs (Lisinopril) .Marland Kitchen... Take 1 tablet by mouth once a day    Glucotrol Xl 5 Mg Tb24 (Glipizide) ..... One by mouth q am    Micardis Hct 80-25 Mg Tabs (Telmisartan-hctz) ..... One by mouth q am    Actos 15 Mg Tabs (Pioglitazone hcl) .Marland Kitchen... 1 tablet by mouth each am  Orders: UA Dipstick w/o Micro (manual) (57846) T-Urine Microalbumin w/creat. ratio [phone])  Problem # 3:   HYPERLIPIDEMIA (ICD-272.4) continue current meds Her updated medication list for this problem includes:    Niaspan 1000 Mg Tbcr (Niacin (antihyperlipidemic)) .Marland Kitchen... Take 1 tablet by mouth once a day for cholesterol.also should be on lipitor    Crestor 10 Mg Tabs (Rosuvastatin calcium) .Marland Kitchen... Take 1 tablet by mouth once a day  Problem # 4:  LEFT BREAST CALCIFICATIONS (ICD-793.89) Pt had recent mammogram reviewed with pt the dx lipoma noted under left axilla  Problem # 5:  OBESITY (ICD-278.00) pt to restart her exercise  routine  Complete Medication List: 1)  Metformin Hcl 500 Mg Tabs (Metformin hcl) .... One by mouth two times a day 2)  Lisinopril 40 Mg Tabs (Lisinopril) .... Take 1 tablet by mouth once a day 3)  Glucotrol Xl 5 Mg Tb24 (Glipizide) .... One by mouth q am 4)  Micardis Hct 80-25 Mg Tabs (Telmisartan-hctz) .... One by mouth q am 5)  Actos 15 Mg Tabs (Pioglitazone hcl) .Marland Kitchen.. 1 tablet by mouth each am 6)  Niaspan 1000 Mg Tbcr (Niacin (antihyperlipidemic)) .... Take 1 tablet by mouth once a day for cholesterol.also should be on lipitor 7)  Crestor 10 Mg Tabs (Rosuvastatin calcium) .... Take 1 tablet by mouth once a day 8)  Ferrous Sulfate 325 (65 Fe) Mg Tabs (Ferrous sulfate) .... Take 1 tablet by mouth once a day  Diabetes Management Assessment/Plan:      The following lipid goals have been established for the patient: Total cholesterol goal of 200; LDL cholesterol goal of 100; HDL cholesterol goal of 40; Triglyceride goal of 150.  Her blood pressure goal is < 130/80.    Hypertension Assessment/Plan:      The patient's hypertensive risk group is category C: Target organ damage and/or diabetes.  Her calculated 10 year risk of coronary heart disease is 13 %.  Today's blood pressure is 116/82.  Her blood pressure goal is < 130/80.  Lipid Assessment/Plan:      Based on NCEP/ATP III, the patient's risk factor category is "history of diabetes".  The patient's lipid goals are as  follows: Total cholesterol goal is 200; LDL cholesterol goal is 100; HDL cholesterol goal is 40; Triglyceride goal is 150.     Patient Instructions: 1)  Diabetes - Your Hgba1c = 7.0 2)  This is slightly increased from before but it should get better if you restart your exercise 3)  Left breast - make sure you keep your appointment for recheck in 6 months.   4)  Left underarm is likely due to  lipoma 5)  Follow up in 3 months for a complete physical exam.  6)  Come fasting for this appointment.  no food after midnight before this visit. Prescriptions: METFORMIN HCL 500 MG TABS (METFORMIN HCL) one by mouth two times a day  #60 x 11   Entered and Authorized by:   Lehman Prom FNP   Signed by:   Lehman Prom FNP on 06/19/2010   Method used:   Faxed to ...       [facility] (retail)       [address] [place], [place]  [address]       Ph: [phone]       Fax: [phone]   RxID:   [id number] GLUCOTROL XL 5 MG TB24 (GLIPIZIDE) one by mouth q am  #30 x 11   Entered and Authorized by:   Lehman Prom FNP   Signed by:   Lehman Prom FNP on 06/19/2010   Method used:   Faxed to ...       [facility] - [facility] (retail)       [address] [place], [place]  [address]       Ph: [phone]       Fax: [phone]   RxID:   [id number] ACTOS 15 MG  TABS (PIOGLITAZONE HCL) 1 tablet by mouth each AM  #30 x 11   Entered  and Authorized by:   Lehman Prom FNP   Signed by:   Lehman Prom FNP on 06/19/2010   Method used:   Faxed to ...       [facility] - [facility] (retail)       [address] [place], [place]  [address]       Ph: [phone]       Fax: [phone]   RxID:   [id number] CRESTOR 10 MG TABS (ROSUVASTATIN CALCIUM) Take 1 tablet by mouth once a day  #30 x 11   Entered and Authorized by:   Lehman Prom FNP   Signed by:   Lehman Prom FNP on 06/19/2010   Method used:   Faxed to ...       [facility] - [facility] (retail)       [address] [place], [place]  [address]       Ph: [phone]       Fax: [phone]   RxID:   [id number] MICARDIS HCT 80-25 MG TABS (TELMISARTAN-HCTZ) one by mouth q am  #30 x 11   Entered and Authorized by:   Lehman Prom FNP   Signed by:   Lehman Prom FNP on 06/19/2010   Method used:   Faxed to ...       [facility] - [facility] (retail)       [address] [place], [place]  [address]       Ph: [phone]       Fax: [phone]   RxID:   [id number] LISINOPRIL 40 MG TABS (LISINOPRIL) Take 1 tablet by mouth once a day  #30 x 11   Entered and Authorized by:   Lehman Prom FNP   Signed by:   Lehman Prom FNP on 06/19/2010   Method used:   Faxed to ...       [facility] - [facility] (retail)       [address] [place], [place]  [address]       Ph: [phone]       Fax: [phone]   RxID:   [id number]    Orders Added: 1)  Est. Patient Level IV [01751] 2)  UA Dipstick w/o Micro (manual) [81002] 3)  T-Urine Microalbumin w/creat. ratio [[id number]]     Last LDL:                                                 45 (08/21/2009 9:59:00 PM)        Diabetic Foot Exam    10-g (5.07) Semmes-Weinstein Monofilament Test Performed by: Levon Hedger          Right Foot          Left Foot Visual Inspection               Test Control      normal         normal Site 1         normal         normal Site 2         normal         normal Site 3  normal         normal Site 4         normal         normal Site 5         normal         normal Site 6         normal         normal Site 7         normal         normal Site 8         normal         normal Site 9         normal         normal Site 10         normal          normal  Impression      normal         normal   Laboratory Results   Urine Tests  Date/Time Received: June 19, 2010 10:06 AM   Routine Urinalysis   Color: yellow Appearance: Clear Glucose: negative   (Normal Range: Negative) Bilirubin: small   (Normal Range: Negative) Ketone: trace (5)   (Normal Range: Negative) Spec. Gravity: >=1.030   (Normal Range: 1.003-1.035) Blood: negative   (Normal Range: Negative) pH: 5.0   (Normal Range: 5.0-8.0) Protein: trace   (Normal Range: Negative) Urobilinogen: 1.0   (Normal Range: 0-1) Nitrite: negative   (Normal Range: Negative) Leukocyte Esterace: negative   (Normal Range: Negative)     Blood Tests   Date/Time Received: June 19, 2010 10:04 AM   HGBA1C: 7.0%   (Normal Range: Non-Diabetic - 3-6%   Control Diabetic - 6-8%) CBG Random:: 130

## 2010-06-25 NOTE — Letter (Signed)
Summary: Handout Printed  Printed Handout:  - Lipoma (Fatty Tumor) 

## 2010-08-01 ENCOUNTER — Emergency Department (HOSPITAL_COMMUNITY)
Admission: EM | Admit: 2010-08-01 | Discharge: 2010-08-01 | Disposition: A | Discharge status: Home / Self Care | Payer: Self-pay | Attending: Emergency Medicine | Admitting: Emergency Medicine

## 2010-08-01 DIAGNOSIS — T148XXA Other injury of unspecified body region, initial encounter: Secondary | ICD-10-CM | POA: Insufficient documentation

## 2010-08-01 DIAGNOSIS — M542 Cervicalgia: Secondary | ICD-10-CM | POA: Insufficient documentation

## 2010-08-01 DIAGNOSIS — I10 Essential (primary) hypertension: Secondary | ICD-10-CM | POA: Insufficient documentation

## 2010-08-01 DIAGNOSIS — E119 Type 2 diabetes mellitus without complications: Secondary | ICD-10-CM | POA: Insufficient documentation

## 2010-10-08 ENCOUNTER — Other Ambulatory Visit: Payer: Self-pay | Admitting: Internal Medicine

## 2010-10-08 DIAGNOSIS — R921 Mammographic calcification found on diagnostic imaging of breast: Secondary | ICD-10-CM

## 2010-10-09 ENCOUNTER — Other Ambulatory Visit: Payer: Self-pay | Admitting: Internal Medicine

## 2010-10-09 DIAGNOSIS — R921 Mammographic calcification found on diagnostic imaging of breast: Secondary | ICD-10-CM

## 2010-10-16 ENCOUNTER — Ambulatory Visit
Admission: RE | Admit: 2010-10-16 | Discharge: 2010-10-16 | Disposition: A | Discharge status: Home / Self Care | Payer: Self-pay | Source: Ambulatory Visit | Attending: Internal Medicine | Admitting: Internal Medicine

## 2010-10-16 DIAGNOSIS — R921 Mammographic calcification found on diagnostic imaging of breast: Secondary | ICD-10-CM

## 2010-12-23 IMAGING — MG MM DIGITAL SCREENING
4 series · 4 of 4 positions shown · non-contrast
Comparison: none

DG SCREEN MAMMOGRAM BILATERAL
Bilateral CC and MLO view(s) were taken.

DIGITAL SCREENING MAMMOGRAM WITH CAD:
The breast tissue is heterogeneously dense.  Microcalcifications are present in the left breast.  
Characterization with magnification views is recommended.  No mass or malignant type calcifications
are identified in the right breast.  Compared with prior studies.
Images were processed with CAD.

[R CC]
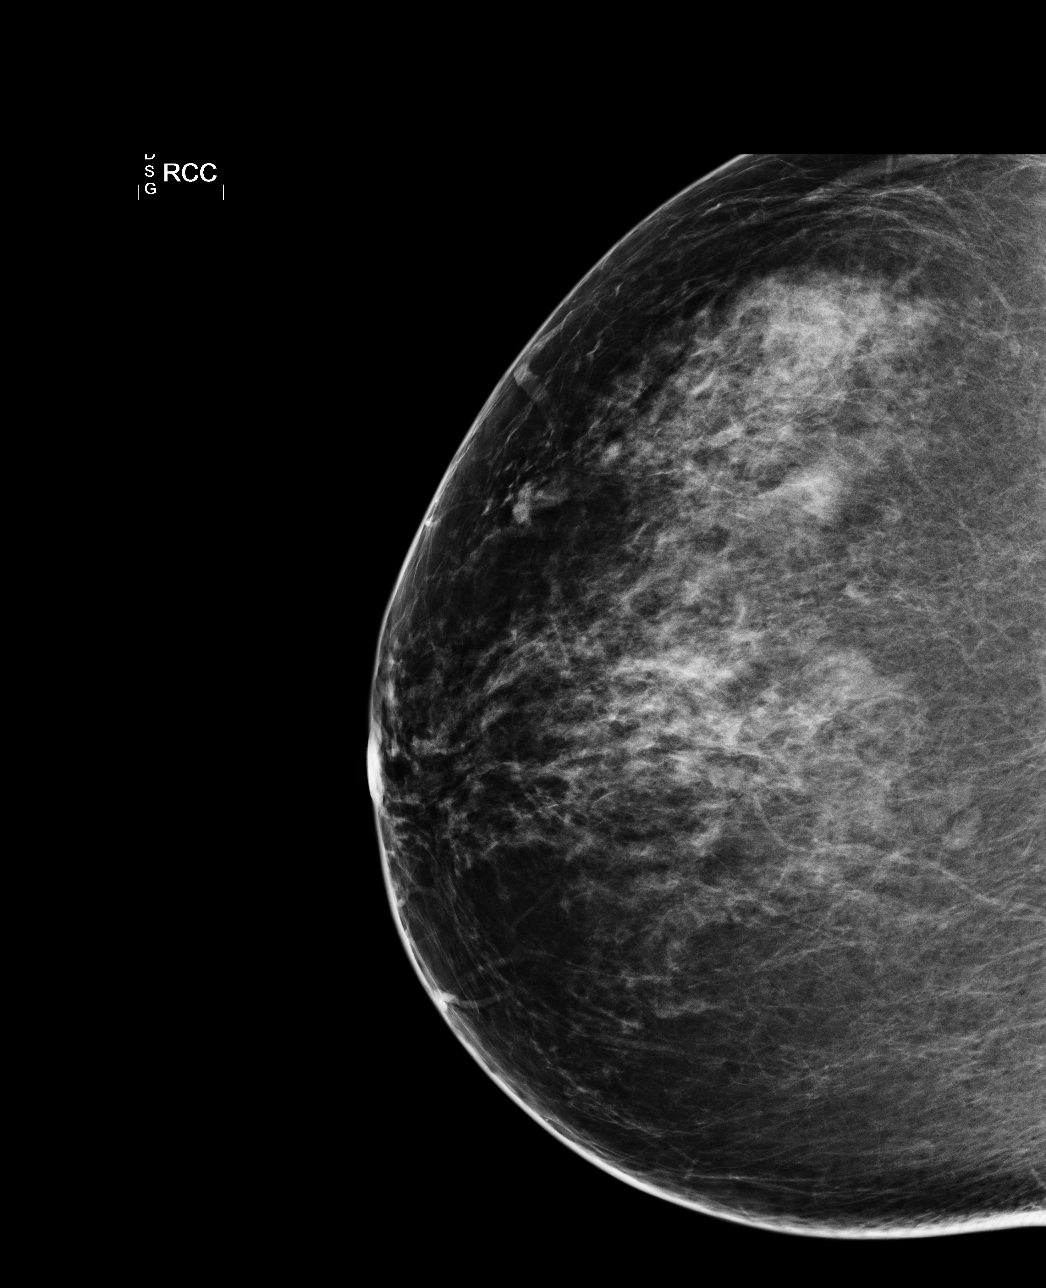

[R MLO]
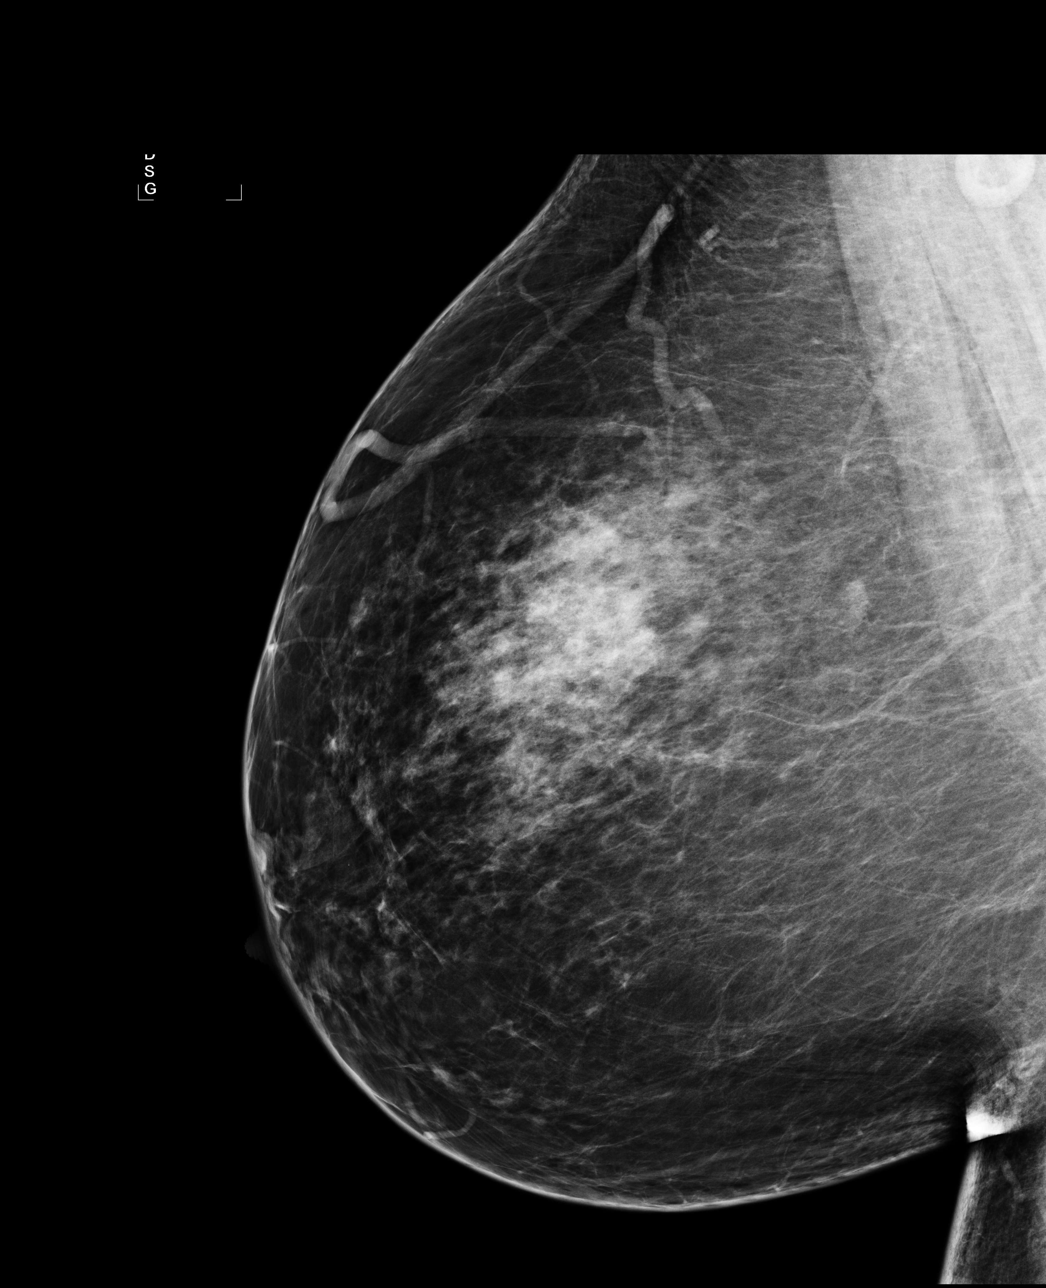

[L CC]
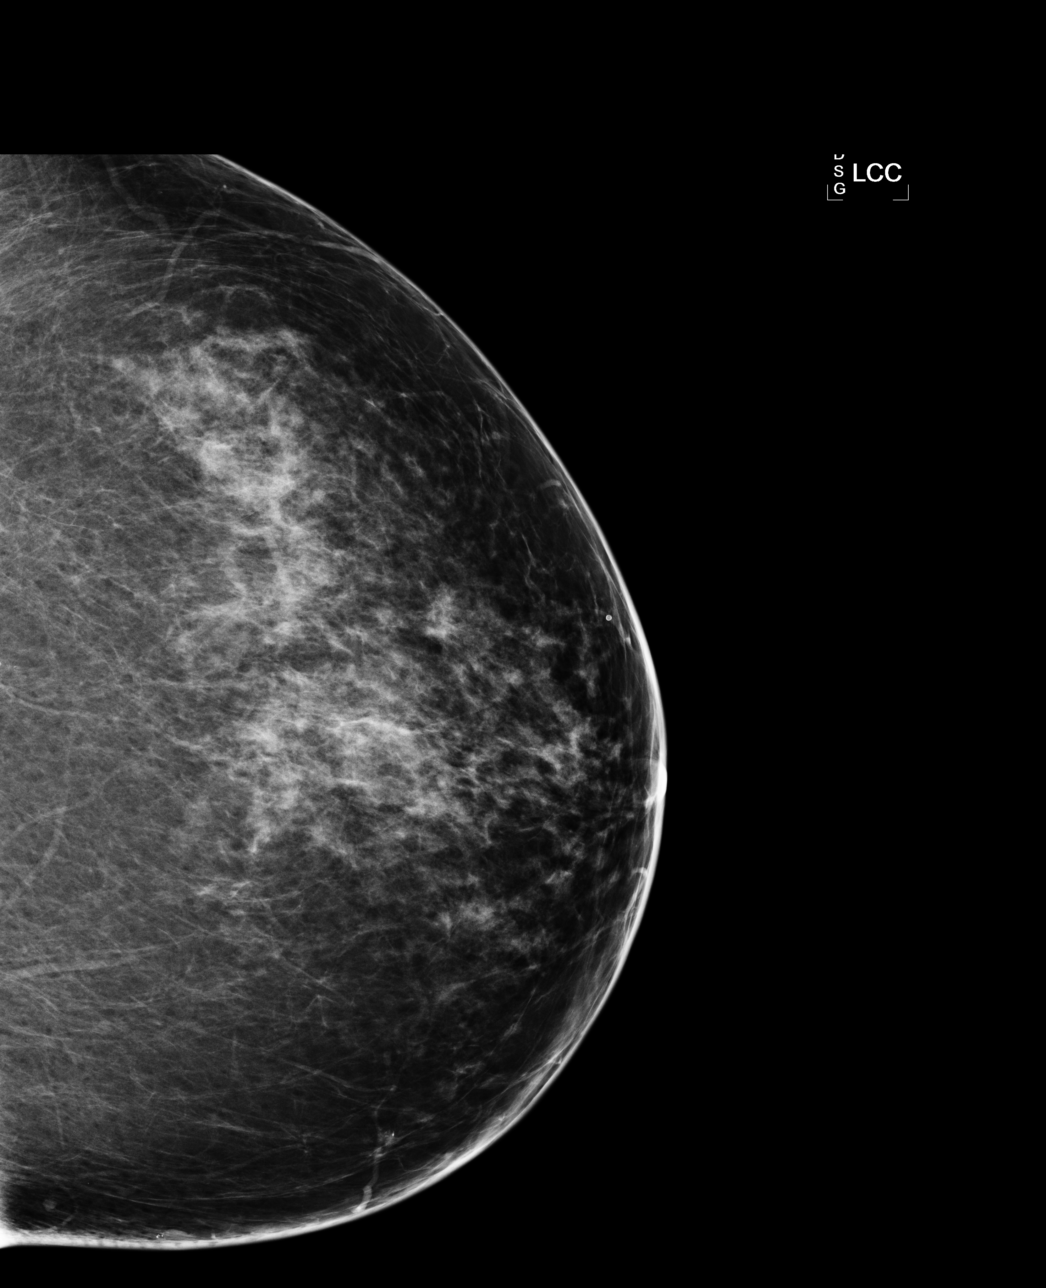

[L MLO]
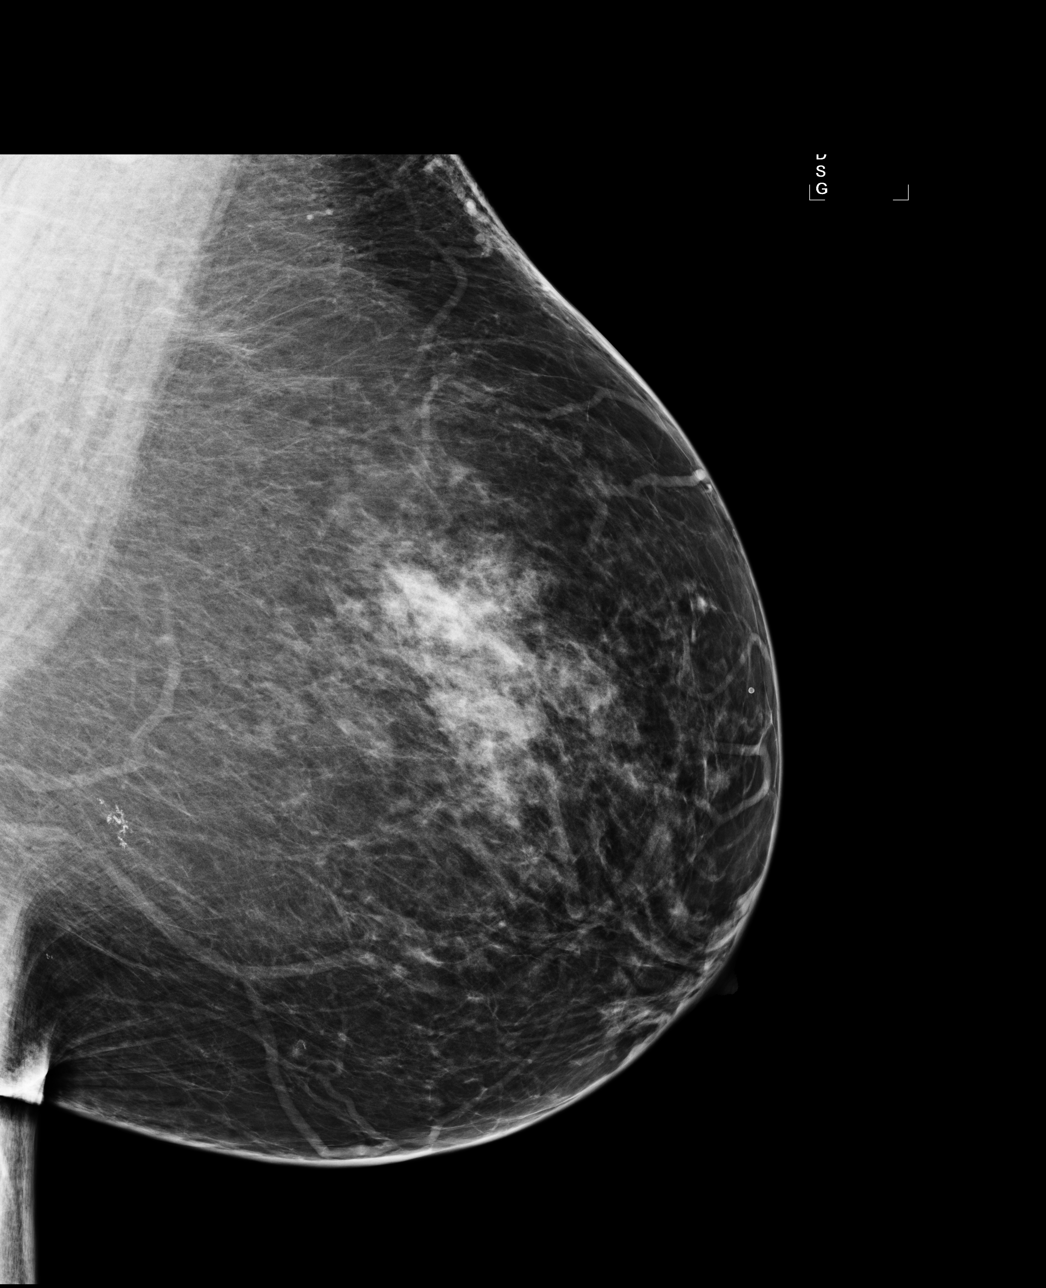

[4 of 4 positions shown; findings below may reference images not displayed]

IMPRESSION: Calcifications, left breast.  Additional evaluation is indicated. The patient will be contacted for
additional studies and a supplementary report will follow.  No specific mammographic evidence of 
malignancy, right breast.

ASSESSMENT: Need additional imaging evaluation and/or prior mammograms for comparison - BI-RADS 0

Further imaging of the left breast.
,

## 2011-01-29 LAB — BASIC METABOLIC PANEL
BUN: 10
Chloride: 104
Glucose, Bld: 141 — ABNORMAL HIGH
Potassium: 3.6

## 2011-01-29 LAB — CBC
HCT: 32.5 — ABNORMAL LOW
MCV: 94.5
Platelets: 338
RDW: 15.5 — ABNORMAL HIGH
WBC: 8.5

## 2011-01-29 LAB — POCT CARDIAC MARKERS
CKMB, poc: <1 — ABNORMAL LOW
Myoglobin, poc: 70.3

## 2011-04-27 ENCOUNTER — Other Ambulatory Visit: Payer: Self-pay | Admitting: Internal Medicine

## 2011-04-27 DIAGNOSIS — R920 Mammographic microcalcification found on diagnostic imaging of breast: Secondary | ICD-10-CM

## 2011-05-11 ENCOUNTER — Ambulatory Visit
Admission: RE | Admit: 2011-05-11 | Discharge: 2011-05-11 | Disposition: A | Discharge status: Home / Self Care | Payer: Self-pay | Source: Ambulatory Visit | Attending: Internal Medicine | Admitting: Internal Medicine

## 2011-05-11 ENCOUNTER — Other Ambulatory Visit: Payer: Self-pay | Admitting: Internal Medicine

## 2011-05-11 DIAGNOSIS — R920 Mammographic microcalcification found on diagnostic imaging of breast: Secondary | ICD-10-CM

## 2011-05-18 ENCOUNTER — Ambulatory Visit (INDEPENDENT_AMBULATORY_CARE_PROVIDER_SITE_OTHER): Payer: Self-pay | Admitting: *Deleted

## 2011-05-18 VITALS — BP 132/90 | HR 83 | Temp 97.8°F | Resp 16 | Ht 63.0 in | Wt 208.1 lb

## 2011-05-18 DIAGNOSIS — Z1239 Encounter for other screening for malignant neoplasm of breast: Secondary | ICD-10-CM

## 2011-05-18 NOTE — Progress Notes (Signed)
Complaints of small lump in left axilla. Patient referred from the [facility] for area of concern within left lower breast needing a follow up breast biopsy.  Pap Smear:    Pap smear not performed today. Patients last Pap smear was in March 2012 at [facility] and was normal per patient. Last Pap smear report in [facility] was 08/21/09 and was normal. Per patient she had an abnormal Pap smear in the 1980's that required cryo.  Suggested she go to one of the free Pap smear screenings the [facility] Center offers next year for follow-up.   Physical exam: Breasts Breasts symmetrical. No skin abnormalities bilateral breasts. No nipple retraction bilateral breasts. No nipple discharge bilateral breasts. No lymphadenopathy. No lumps palpated bilateral breasts. Palpated a small pea like lump in left axilla. Per patient has been there for 10 years and patient stated has increased in size over the last 5 years. No complaints of pain or tenderness on palpation. Patient referred to the [facility] for a left breast biopsy for follow up of a cluster of microcalcifications in the lower left breast. Her appointment is scheduled for Friday, May 21, 2011 at 0730.         Pelvic/Bimanual No Pap smear completed today since last Pap smear was March 2012 and normal per patient. Pap smear not indicated per BCCCP guidelines.

## 2011-05-18 NOTE — Patient Instructions (Signed)
Taught patient how to perform BSE and gave educational materials to take home. Patient did not need a Pap smear today due to last Pap smear was around March 2012 and normal per patient. Told patient about free cervical cancer screenings to receive a Pap smear if would like one next year. Let her know BCCCP will cover Pap smears every 3 years unless has a history of abnormal Pap smears. Patient is scheduled for a left breast biopsy Friday, May 21, 2011 at 0730. Patient aware of appointment and will be there. Let patient know will follow up with her within the next couple weeks with results. Patient verbalized understanding.

## 2011-05-21 ENCOUNTER — Ambulatory Visit
Admission: RE | Admit: 2011-05-21 | Discharge: 2011-05-21 | Disposition: A | Discharge status: Home / Self Care | Payer: No Typology Code available for payment source | Source: Ambulatory Visit | Attending: Internal Medicine | Admitting: Internal Medicine

## 2011-05-21 DIAGNOSIS — R920 Mammographic microcalcification found on diagnostic imaging of breast: Secondary | ICD-10-CM

## 2011-06-09 ENCOUNTER — Other Ambulatory Visit: Payer: Self-pay | Admitting: Family Medicine

## 2012-03-08 ENCOUNTER — Encounter (HOSPITAL_COMMUNITY): Payer: Self-pay

## 2012-03-08 ENCOUNTER — Emergency Department (HOSPITAL_COMMUNITY)
Admission: EM | Admit: 2012-03-08 | Discharge: 2012-03-08 | Disposition: A | Discharge status: Home / Self Care | Payer: Self-pay | Source: Home / Self Care

## 2012-03-08 DIAGNOSIS — E785 Hyperlipidemia, unspecified: Secondary | ICD-10-CM

## 2012-03-08 DIAGNOSIS — R809 Proteinuria, unspecified: Secondary | ICD-10-CM

## 2012-03-08 DIAGNOSIS — D649 Anemia, unspecified: Secondary | ICD-10-CM

## 2012-03-08 DIAGNOSIS — I10 Essential (primary) hypertension: Secondary | ICD-10-CM

## 2012-03-08 DIAGNOSIS — E119 Type 2 diabetes mellitus without complications: Secondary | ICD-10-CM

## 2012-03-08 MED ORDER — ROSUVASTATIN CALCIUM 10 MG PO TABS: 10.0000 mg | ORAL_TABLET | Freq: Every day | ORAL | Status: DC

## 2012-03-08 MED ORDER — LISINOPRIL 40 MG PO TABS: 40.0000 mg | ORAL_TABLET | Freq: Every day | ORAL | Status: DC

## 2012-03-08 MED ORDER — GLIPIZIDE ER 5 MG PO TB24: 5.0000 mg | ORAL_TABLET | Freq: Every day | ORAL | Status: DC

## 2012-03-08 MED ORDER — METFORMIN HCL 500 MG PO TABS: 500.0000 mg | ORAL_TABLET | Freq: Two times a day (BID) | ORAL | Status: DC

## 2012-03-08 MED ORDER — TELMISARTAN-HCTZ 80-25 MG PO TABS: 1.0000 | ORAL_TABLET | Freq: Every day | ORAL | Status: DC

## 2012-03-08 NOTE — ED Notes (Signed)
Here for medication management and HA ; out of her crestor and micardis

## 2012-03-08 NOTE — ED Provider Notes (Signed)
History    CSN: [id number]  Arrival date & time 03/08/12  1027    Chief Complaint  Patient presents with  . Medication Management   HPI  Pt says that she needs her blood pressure medications refilled.    Past Medical History  Diagnosis Date  . Hypertension   . Diabetes mellitus     History reviewed. No pertinent past surgical history.  Family History  Problem Relation Age of Onset  . Diabetes Maternal Grandmother   . Hypertension Maternal Grandmother     History  Substance Use Topics  . Smoking status: Never Smoker   . Smokeless tobacco: Not on file  . Alcohol Use: No    OB History    Grav Para Term Preterm Abortions TAB SAB Ect Mult Living   2         2      Review of Systems  Allergies  Penicillins  Home Medications   Current Outpatient Rx  Name  Route  Sig  Dispense  Refill  . GLIPIZIDE 5 MG PO TABS   Oral   Take 5 mg by mouth 2 (two) times daily before a meal.         . METFORMIN HCL 500 MG PO TABS   Oral   Take 500 mg by mouth 2 (two) times daily with a meal.         . ROSUVASTATIN CALCIUM 10 MG PO TABS   Oral   Take 10 mg by mouth daily.         . TELMISARTAN-HCTZ 80-25 MG PO TABS   Oral   Take 1 tablet by mouth daily.         . TRIAMCINOLONE ACETONIDE 0.1 % EX OINT   Topical   Apply topically 2 (two) times daily.           BP 143/78  Pulse 54  Temp 97.6 F (36.4 C) (Oral)  SpO2 94%  LMP 01/31/2012  Physical Exam  ED Course  Procedures (including critical care time)  Labs Reviewed - No data to display No results found.   No diagnosis found.   MDM  IMPRESSION  Hypertension  Proteinuria  Hyperlipidemia  Type 2 DM  Atopic dermatitis  RECOMMENDATIONS / PLAN  Refilled medications Pt had flu vaccine at job Pt says she is on ACEI and ARB therapy from her nephrologist because of the proteinuria but was placed on this 10 years ago and has not followed up recently with nephrology.  Pt says that her BP is  well controlled on her current regimen.  Will continue her current regimen for now but I advised pt to please follow up with her nephrologist for recheck and I requested that pt please bring [place] her old records from [facility] so that I could review them.  She verbalized understanding and agreed to do so.  Monitor BS closely and bring BS readings to next office visit  FOLLOW UP 1 month for labwork   The patient was given clear instructions to go to ER or return to medical center if symptoms don't improve, worsen or new problems develop.  The patient verbalized understanding.  The patient was told to call to get lab results if they haven't heard anything in the next week.     Rodney Langton, MD, CDE, FAAFP [facility] [place], [place]         Cleora Fleet, MD 03/08/12 [phone]

## 2012-04-05 NOTE — ED Notes (Signed)
Pt called this am claiming she lost her script and wanted [place] to call in a new prescription.She did not give the name of the medication. i tried several times(5) to return her call [phone] and the phone went immediately to a voice mail box that has not been set up .unable to leave message

## 2012-04-14 MED ORDER — GLIPIZIDE ER 5 MG PO TB24: 5.0000 mg | ORAL_TABLET | Freq: Every day | ORAL | Status: AC

## 2012-04-14 MED ORDER — TRIAMCINOLONE ACETONIDE 0.1 % EX OINT: TOPICAL_OINTMENT | Freq: Two times a day (BID) | CUTANEOUS | Status: DC

## 2012-04-14 MED ORDER — TELMISARTAN-HCTZ 80-25 MG PO TABS: 1.0000 | ORAL_TABLET | Freq: Every day | ORAL | Status: DC

## 2012-04-14 MED ORDER — ROSUVASTATIN CALCIUM 10 MG PO TABS: 10.0000 mg | ORAL_TABLET | Freq: Every day | ORAL | Status: AC

## 2012-04-14 MED ORDER — METFORMIN HCL 500 MG PO TABS: 500.0000 mg | ORAL_TABLET | Freq: Two times a day (BID) | ORAL | Status: AC

## 2012-04-14 MED ORDER — LISINOPRIL 40 MG PO TABS: 40.0000 mg | ORAL_TABLET | Freq: Every day | ORAL | Status: AC

## 2012-05-09 ENCOUNTER — Other Ambulatory Visit: Payer: Self-pay | Admitting: Family Medicine

## 2012-05-09 DIAGNOSIS — Z1231 Encounter for screening mammogram for malignant neoplasm of breast: Secondary | ICD-10-CM

## 2012-06-07 ENCOUNTER — Ambulatory Visit
Admission: RE | Admit: 2012-06-07 | Discharge: 2012-06-07 | Disposition: A | Discharge status: Home / Self Care | Payer: BC Managed Care – PPO | Source: Ambulatory Visit | Attending: Family Medicine | Admitting: Family Medicine

## 2012-06-07 ENCOUNTER — Other Ambulatory Visit: Payer: Self-pay | Admitting: Family Medicine

## 2012-06-07 DIAGNOSIS — Z1231 Encounter for screening mammogram for malignant neoplasm of breast: Secondary | ICD-10-CM

## 2012-10-27 ENCOUNTER — Other Ambulatory Visit: Payer: Self-pay | Admitting: Licensed Clinical Social Worker

## 2012-10-27 DIAGNOSIS — I10 Essential (primary) hypertension: Secondary | ICD-10-CM

## 2013-05-22 ENCOUNTER — Other Ambulatory Visit: Payer: Self-pay

## 2013-05-22 DIAGNOSIS — Z1231 Encounter for screening mammogram for malignant neoplasm of breast: Secondary | ICD-10-CM

## 2013-06-11 ENCOUNTER — Ambulatory Visit: Payer: BC Managed Care – PPO

## 2013-06-22 ENCOUNTER — Ambulatory Visit
Admission: RE | Admit: 2013-06-22 | Discharge: 2013-06-22 | Disposition: A | Discharge status: Home / Self Care | Payer: BC Managed Care – PPO | Source: Ambulatory Visit

## 2013-06-22 DIAGNOSIS — Z1231 Encounter for screening mammogram for malignant neoplasm of breast: Secondary | ICD-10-CM

## 2014-02-18 ENCOUNTER — Encounter (HOSPITAL_COMMUNITY): Payer: Self-pay

## 2014-02-20 ENCOUNTER — Other Ambulatory Visit: Payer: Self-pay | Admitting: Obstetrics & Gynecology

## 2014-02-22 ENCOUNTER — Inpatient Hospital Stay (HOSPITAL_COMMUNITY): Admission: RE | Admit: 2014-02-22 | Payer: BC Managed Care – PPO | Source: Ambulatory Visit

## 2014-02-27 ENCOUNTER — Encounter (HOSPITAL_COMMUNITY)
Admission: RE | Admit: 2014-02-27 | Discharge: 2014-02-27 | Disposition: A | Discharge status: Home / Self Care | Payer: BC Managed Care – PPO | Source: Ambulatory Visit | Attending: Obstetrics & Gynecology | Admitting: Obstetrics & Gynecology

## 2014-02-27 ENCOUNTER — Encounter (HOSPITAL_COMMUNITY): Payer: Self-pay

## 2014-02-27 ENCOUNTER — Other Ambulatory Visit: Payer: Self-pay

## 2014-02-27 DIAGNOSIS — R938 Abnormal findings on diagnostic imaging of other specified body structures: Secondary | ICD-10-CM | POA: Diagnosis not present

## 2014-02-27 DIAGNOSIS — Z6835 Body mass index (BMI) 35.0-35.9, adult: Secondary | ICD-10-CM | POA: Diagnosis not present

## 2014-02-27 DIAGNOSIS — N840 Polyp of corpus uteri: Secondary | ICD-10-CM | POA: Diagnosis not present

## 2014-02-27 DIAGNOSIS — K219 Gastro-esophageal reflux disease without esophagitis: Secondary | ICD-10-CM | POA: Diagnosis not present

## 2014-02-27 DIAGNOSIS — I10 Essential (primary) hypertension: Secondary | ICD-10-CM | POA: Diagnosis not present

## 2014-02-27 DIAGNOSIS — N950 Postmenopausal bleeding: Secondary | ICD-10-CM | POA: Diagnosis present

## 2014-02-27 DIAGNOSIS — E119 Type 2 diabetes mellitus without complications: Secondary | ICD-10-CM | POA: Diagnosis not present

## 2014-02-27 HISTORY — DX: Dermatitis, unspecified: L30.9

## 2014-02-27 HISTORY — DX: Gastro-esophageal reflux disease without esophagitis: K21.9

## 2014-02-27 HISTORY — DX: Pure hypercholesterolemia, unspecified: E78.00

## 2014-02-27 LAB — COMPREHENSIVE METABOLIC PANEL
ALBUMIN: 3.6 g/dL (ref 3.5–5.2)
ALT: 20 U/L (ref 0–35)
ANION GAP: 13 (ref 5–15)
AST: 17 U/L (ref 0–37)
Alkaline Phosphatase: 55 U/L (ref 39–117)
BILIRUBIN TOTAL: 0.5 mg/dL (ref 0.3–1.2)
BUN: 9 mg/dL (ref 6–23)
CO2: 26 mEq/L (ref 19–32)
Calcium: 9.7 mg/dL (ref 8.4–10.5)
Chloride: 100 mEq/L (ref 96–112)
Creatinine, Ser: 0.83 mg/dL (ref 0.50–1.10)
GFR calc Af Amer: >90 mL/min (ref >90)
GFR calc non Af Amer: 81 mL/min — ABNORMAL LOW (ref >90)
Glucose, Bld: 169 mg/dL — ABNORMAL HIGH (ref 70–99)
Potassium: 3.5 mEq/L — ABNORMAL LOW (ref 3.7–5.3)
SODIUM: 139 meq/L (ref 137–147)
TOTAL PROTEIN: 7.2 g/dL (ref 6.0–8.3)

## 2014-02-27 LAB — CBC
HCT: 37.8 % (ref 36.0–46.0)
Hemoglobin: 12.5 g/dL (ref 12.0–15.0)
MCH: 31.2 pg (ref 26.0–34.0)
MCHC: 33.1 g/dL (ref 30.0–36.0)
MCV: 94.3 fL (ref 78.0–100.0)
PLATELETS: 296 10*3/uL (ref 150–400)
RBC: 4.01 MIL/uL (ref 3.87–5.11)
RDW: 15.2 % (ref 11.5–15.5)
WBC: 6 10*3/uL (ref 4.0–10.5)

## 2014-02-27 NOTE — Patient Instructions (Addendum)
Your procedure is scheduled on:  Friday, NOV. 13, 2015  Enter through the Main Entrance of [facility] at:  1:00 P.M.  Pick up the phone at the desk and dial [phone].  Call this number if you have problems the morning of surgery: [phone].  Remember: Do NOT eat food: AFTER MIDNIGHT THURSDAY Do NOT drink clear liquids after: AFTER 9:00 A.M. FRIDAY Take these medicines the morning of surgery with a SIP OF WATER: HYDROCHLOROTHIAZIDE, LISINOPRIL  * STOP ALL VITAMINS/HERBAL SUPPLEMENTS NOW  *DO NOT TAKE METFORMIN 24 HOURS PRIOR TO SURGERY.  Do NOT wear jewelry (body piercing), metal hair clips/bobby pins, make-up, or nail polish. Do NOT wear lotions, powders, or perfumes.  You may wear deoderant. Do NOT shave for 48 hours prior to surgery. Do NOT bring valuables to the hospital. Contacts, dentures, or bridgework may not be worn into surgery. Have a responsible adult drive you home and stay with you for 24 hours after your procedure

## 2014-02-27 NOTE — Pre-Procedure Instructions (Signed)
Dr. Rudean Curt made aware of EKG results.  No new orders received at this time.

## 2014-03-01 ENCOUNTER — Encounter (HOSPITAL_COMMUNITY): Payer: Self-pay | Admitting: Anesthesiology

## 2014-03-01 ENCOUNTER — Ambulatory Visit (HOSPITAL_COMMUNITY): Payer: BC Managed Care – PPO | Admitting: Anesthesiology

## 2014-03-01 ENCOUNTER — Encounter (HOSPITAL_COMMUNITY)
Admission: RE | Disposition: A | Discharge status: Home / Self Care | Payer: Self-pay | Source: Ambulatory Visit | Attending: Obstetrics & Gynecology

## 2014-03-01 ENCOUNTER — Ambulatory Visit (HOSPITAL_COMMUNITY)
Admission: RE | Admit: 2014-03-01 | Discharge: 2014-03-01 | Disposition: A | Discharge status: Home / Self Care | Payer: BC Managed Care – PPO | Source: Ambulatory Visit | Attending: Obstetrics & Gynecology | Admitting: Obstetrics & Gynecology

## 2014-03-01 DIAGNOSIS — E119 Type 2 diabetes mellitus without complications: Secondary | ICD-10-CM | POA: Insufficient documentation

## 2014-03-01 DIAGNOSIS — I10 Essential (primary) hypertension: Secondary | ICD-10-CM | POA: Insufficient documentation

## 2014-03-01 DIAGNOSIS — N950 Postmenopausal bleeding: Secondary | ICD-10-CM | POA: Diagnosis not present

## 2014-03-01 DIAGNOSIS — R938 Abnormal findings on diagnostic imaging of other specified body structures: Secondary | ICD-10-CM | POA: Insufficient documentation

## 2014-03-01 DIAGNOSIS — Z6835 Body mass index (BMI) 35.0-35.9, adult: Secondary | ICD-10-CM | POA: Insufficient documentation

## 2014-03-01 DIAGNOSIS — K219 Gastro-esophageal reflux disease without esophagitis: Secondary | ICD-10-CM | POA: Insufficient documentation

## 2014-03-01 DIAGNOSIS — N840 Polyp of corpus uteri: Secondary | ICD-10-CM | POA: Insufficient documentation

## 2014-03-01 HISTORY — PX: DILITATION & CURRETTAGE/HYSTROSCOPY WITH VERSAPOINT RESECTION: SHX5571

## 2014-03-01 LAB — GLUCOSE, CAPILLARY
GLUCOSE-CAPILLARY: 135 mg/dL — AB (ref 70–99)
Glucose-Capillary: 180 mg/dL — ABNORMAL HIGH (ref 70–99)

## 2014-03-01 SURGERY — DILATATION & CURETTAGE/HYSTEROSCOPY WITH VERSAPOINT RESECTION
Anesthesia: General

## 2014-03-01 MED ORDER — PROMETHAZINE HCL 25 MG/ML IJ SOLN: 6.2500 mg | INTRAMUSCULAR | Status: DC | PRN

## 2014-03-01 MED ORDER — FENTANYL CITRATE 0.05 MG/ML IJ SOLN
INTRAMUSCULAR | Status: DC | PRN
  Administered 2014-03-01 (×2): 50 ug via INTRAVENOUS

## 2014-03-01 MED ORDER — ONDANSETRON HCL 4 MG/2ML IJ SOLN
INTRAMUSCULAR | Status: AC
  Filled 2014-03-01: qty 2

## 2014-03-01 MED ORDER — ONDANSETRON HCL 4 MG/2ML IJ SOLN
INTRAMUSCULAR | Status: DC | PRN
  Administered 2014-03-01: 4 mg via INTRAVENOUS

## 2014-03-01 MED ORDER — LIDOCAINE HCL (CARDIAC) 20 MG/ML IV SOLN
INTRAVENOUS | Status: AC
  Filled 2014-03-01: qty 5

## 2014-03-01 MED ORDER — HYDROCODONE-IBUPROFEN 5-200 MG PO TABS: 1.0000 | ORAL_TABLET | Freq: Three times a day (TID) | ORAL | Status: DC | PRN

## 2014-03-01 MED ORDER — CHLOROPROCAINE HCL 1 % IJ SOLN
INTRAMUSCULAR | Status: DC | PRN
  Administered 2014-03-01: 20 mL

## 2014-03-01 MED ORDER — SCOPOLAMINE 1 MG/3DAYS TD PT72
1.0000 | MEDICATED_PATCH | Freq: Once | TRANSDERMAL | Status: DC
  Administered 2014-03-01: 1.5 mg via TRANSDERMAL

## 2014-03-01 MED ORDER — SODIUM CHLORIDE 0.9 % IR SOLN
Status: DC | PRN
  Administered 2014-03-01: 3000 mL

## 2014-03-01 MED ORDER — FENTANYL CITRATE 0.05 MG/ML IJ SOLN
INTRAMUSCULAR | Status: AC
  Filled 2014-03-01: qty 2

## 2014-03-01 MED ORDER — MEPERIDINE HCL 25 MG/ML IJ SOLN: 6.2500 mg | INTRAMUSCULAR | Status: DC | PRN

## 2014-03-01 MED ORDER — FENTANYL CITRATE 0.05 MG/ML IJ SOLN
25.0000 ug | INTRAMUSCULAR | Status: DC | PRN
  Administered 2014-03-01: 50 ug via INTRAVENOUS

## 2014-03-01 MED ORDER — SCOPOLAMINE 1 MG/3DAYS TD PT72
MEDICATED_PATCH | TRANSDERMAL | Status: AC
  Filled 2014-03-01: qty 1

## 2014-03-01 MED ORDER — KETOROLAC TROMETHAMINE 30 MG/ML IJ SOLN
INTRAMUSCULAR | Status: AC
  Filled 2014-03-01: qty 1

## 2014-03-01 MED ORDER — DEXAMETHASONE SODIUM PHOSPHATE 10 MG/ML IJ SOLN
INTRAMUSCULAR | Status: AC
  Filled 2014-03-01: qty 1

## 2014-03-01 MED ORDER — CHLOROPROCAINE HCL 1 % IJ SOLN
INTRAMUSCULAR | Status: AC
  Filled 2014-03-01: qty 30

## 2014-03-01 MED ORDER — PROPOFOL 10 MG/ML IV BOLUS
INTRAVENOUS | Status: DC | PRN
  Administered 2014-03-01: 200 mg via INTRAVENOUS

## 2014-03-01 MED ORDER — LIDOCAINE HCL (CARDIAC) 20 MG/ML IV SOLN
INTRAVENOUS | Status: DC | PRN
  Administered 2014-03-01: 60 mg via INTRAVENOUS

## 2014-03-01 MED ORDER — MIDAZOLAM HCL 2 MG/2ML IJ SOLN
INTRAMUSCULAR | Status: AC
  Filled 2014-03-01: qty 2

## 2014-03-01 MED ORDER — DEXAMETHASONE SODIUM PHOSPHATE 10 MG/ML IJ SOLN
INTRAMUSCULAR | Status: DC | PRN
  Administered 2014-03-01: 10 mg via INTRAVENOUS

## 2014-03-01 MED ORDER — KETOROLAC TROMETHAMINE 30 MG/ML IJ SOLN: 15.0000 mg | Freq: Once | INTRAMUSCULAR | Status: AC | PRN

## 2014-03-01 MED ORDER — MIDAZOLAM HCL 2 MG/2ML IJ SOLN
INTRAMUSCULAR | Status: DC | PRN
  Administered 2014-03-01: 2 mg via INTRAVENOUS

## 2014-03-01 MED ORDER — LACTATED RINGERS IV SOLN
INTRAVENOUS | Status: DC
  Administered 2014-03-01 (×3): via INTRAVENOUS

## 2014-03-01 MED ORDER — METRONIDAZOLE IN NACL 5-0.79 MG/ML-% IV SOLN
500.0000 mg | Freq: Once | INTRAVENOUS | Status: AC
  Administered 2014-03-01: .5 g via INTRAVENOUS
  Filled 2014-03-01: qty 100

## 2014-03-01 MED ORDER — KETOROLAC TROMETHAMINE 30 MG/ML IJ SOLN
INTRAMUSCULAR | Status: DC | PRN
  Administered 2014-03-01: 30 mg via INTRAVENOUS

## 2014-03-01 MED ORDER — PROPOFOL 10 MG/ML IV EMUL
INTRAVENOUS | Status: AC
  Filled 2014-03-01: qty 20

## 2014-03-01 SURGICAL SUPPLY — 15 items
CANISTER SUCT 3000ML (MISCELLANEOUS) ×2 IMPLANT
CATH ROBINSON RED A/P 16FR (CATHETERS) ×2 IMPLANT
CLOTH BEACON ORANGE TIMEOUT ST (SAFETY) ×2 IMPLANT
CONTAINER PREFILL 10% NBF 60ML (FORM) ×3 IMPLANT
ELECTRODE ROLLER VERSAPOINT (ELECTRODE) ×1 IMPLANT
ELECTRODE RT ANGLE VERSAPOINT (CUTTING LOOP) ×1 IMPLANT
GLOVE BIO SURGEON STRL SZ 6.5 (GLOVE) ×2 IMPLANT
GLOVE BIOGEL PI IND STRL 7.0 (GLOVE) ×2 IMPLANT
GLOVE BIOGEL PI INDICATOR 7.0 (GLOVE) ×2
GOWN STRL REUS W/TWL LRG LVL3 (GOWN DISPOSABLE) ×4 IMPLANT
PACK VAGINAL MINOR WOMEN LF (CUSTOM PROCEDURE TRAY) ×2 IMPLANT
PAD OB MATERNITY 4.3X12.25 (PERSONAL CARE ITEMS) ×2 IMPLANT
SET TUBING AQUILEX (SET/KITS/TRAYS/PACK) ×1 IMPLANT
TOWEL OR 17X24 6PK STRL BLUE (TOWEL DISPOSABLE) ×4 IMPLANT
WATER STERILE IRR 1000ML POUR (IV SOLUTION) ×2 IMPLANT

## 2014-03-01 NOTE — Anesthesia Preprocedure Evaluation (Addendum)
Anesthesia Evaluation  Patient identified by MRN, date of birth, ID band Patient awake    Reviewed: Allergy & Precautions, H&P , NPO status , Patient's Chart, lab work & pertinent test results, reviewed documented beta blocker date and time   History of Anesthesia Complications Negative for: history of anesthetic complications  Airway Mallampati: I  TM Distance: >3 FB Neck ROM: full    Dental  (+) Poor Dentition Denies loose teeth:   Pulmonary neg pulmonary ROS,  breath sounds clear to auscultation  Pulmonary exam normal       Cardiovascular Exercise Tolerance: Good hypertension, On Medications Rhythm:regular Rate:Normal  hyperlipidemia   Neuro/Psych PSYCHIATRIC DISORDERS Depression negative neurological ROS     GI/Hepatic Neg liver ROS, GERD- (no meds in 3 months)  Medicated,  Endo/Other  diabetes (BG 180 today (usually 130 fasting)), Type 2, Oral Hypoglycemic AgentsMorbid obesity  Renal/GU negative Renal ROS  Female GU complaint     Musculoskeletal   Abdominal   Peds  Hematology negative hematology ROS (+)   Anesthesia Other Findings   Reproductive/Obstetrics negative OB ROS                             Anesthesia Physical Anesthesia Plan  ASA: III  Anesthesia Plan: General LMA   Post-op Pain Management:    Induction:   Airway Management Planned:   Additional Equipment:   Intra-op Plan:   Post-operative Plan:   Informed Consent: I have reviewed the patients History and Physical, chart, labs and discussed the procedure including the risks, benefits and alternatives for the proposed anesthesia with the patient or authorized representative who has indicated his/her understanding and acceptance.   Dental Advisory Given  Plan Discussed with: CRNA and Surgeon  Anesthesia Plan Comments:         Anesthesia Quick Evaluation

## 2014-03-01 NOTE — H&P (Signed)
Jenny Colon is an 48 y.o. female G2P0  RP:  Thickened irregular endometrium, possible polyp/myoma  Pertinent Gynecological History: Menses:  irregular Contraception: Tubal ligation Blood transfusions: none Sexually transmitted diseases: no past history Last mammogram: normal  Last pap: normal  OB History: G2P0   Menstrual History:  No LMP recorded.    Past Medical History  Diagnosis Date  . Hypertension   . Diabetes mellitus   . GERD (gastroesophageal reflux disease)   . Hypercholesteremia   . Eczema     Past Surgical History  Procedure Laterality Date  . Lithotripsy    . Tubal ligation    . Dilation and curettage of uterus      twice  . Uterine fibroid embolization      Family History  Problem Relation Age of Onset  . Diabetes Maternal Grandmother   . Hypertension Maternal Grandmother     Social History:  reports that she has never smoked. She has never used smokeless tobacco. She reports that she does not drink alcohol or use illicit drugs.  Allergies:  Allergies  Allergen Reactions  . Penicillins Rash    Childhood reaction    Prescriptions prior to admission  Medication Sig Dispense Refill Last Dose  . Cholecalciferol (VITAMIN D-3) 5000 UNITS TABS Take 1 tablet by mouth daily.     Marland Kitchen glipiZIDE (GLUCOTROL XL) 5 MG 24 hr tablet Take 1 tablet (5 mg total) by mouth daily. 30 tablet 3 02/28/2014 at 0830  . hydrochlorothiazide (HYDRODIURIL) 25 MG tablet Take 25 mg by mouth daily.   03/01/2014 at 0830  . lisinopril (PRINIVIL,ZESTRIL) 40 MG tablet Take 1 tablet (40 mg total) by mouth daily. 30 tablet 3 03/01/2014 at 0830  . metFORMIN (GLUCOPHAGE) 500 MG tablet Take 1 tablet (500 mg total) by mouth 2 (two) times daily with a meal. 60 tablet 3 02/28/2014 at 0800  . Omega-3 Fatty Acids (FISH OIL) 300 MG CAPS Take 1 capsule by mouth 2 (two) times daily.     Marland Kitchen PRESCRIPTION MEDICATION Apply 1 application topically 2 (two) times daily. Eczema cream - not on record  with the pharmacy     . rosuvastatin (CRESTOR) 10 MG tablet Take 1 tablet (10 mg total) by mouth daily. 30 tablet 3 02/28/2014 at Unknown time  . vitamin B-12 (CYANOCOBALAMIN) 50 MCG tablet Take 50 mcg by mouth daily.     Marland Kitchen telmisartan-hydrochlorothiazide (MICARDIS HCT) 80-25 MG per tablet Take 1 tablet by mouth daily. (Patient not taking: Reported on 02/20/2014) 30 tablet 3   . triamcinolone ointment (KENALOG) 0.1 % Apply topically 2 (two) times daily. (Patient not taking: Reported on 02/20/2014) 30 g 0     ROS  Blood pressure 111/83, pulse 75, temperature 97.9 F (36.6 C), temperature source Oral, resp. rate 20, SpO2 100 %. Physical Exam   Pelvic US with uterine myomas, overall uterine size 411 cc, ovaries wnl. Sonohysto:  Thick endometrial Ullmer, poss polyp/myoma  Results for orders placed or performed during the hospital encounter of 03/01/14 (from the past 24 hour(s))  Glucose, capillary     Status: Abnormal   Collection Time: 03/01/14  1:12 PM  Result Value Ref Range   Glucose-Capillary 180 (H) 70 - 99 mg/dL    No results found.  Assessment/Plan: Thick irregular endometrium with possible polyp of myoma for HSC resection, D+C.  Surgery and risks reviewed.  Jenny Colon,Jenny Colon 03/01/2014, 2:19 PM

## 2014-03-01 NOTE — Discharge Instructions (Signed)
Hysteroscopy, Care After °Refer to this sheet in the next few weeks. These instructions provide you with information on caring for yourself after your procedure. Your health care provider may also give you more specific instructions. Your treatment has been planned according to current medical practices, but problems sometimes occur. Call your health care provider if you have any problems or questions after your procedure.  °WHAT TO EXPECT AFTER THE PROCEDURE °After your procedure, it is typical to have the following: °· You may have some cramping. This normally lasts for a couple days. °· You may have bleeding. This can vary from light spotting for a few days to menstrual-like bleeding for 3-7 days. °HOME CARE INSTRUCTIONS °· Rest for the first 1-2 days after the procedure. °· Only take over-the-counter or prescription medicines as directed by your health care provider. Do not take aspirin. It can increase the chances of bleeding. °· Take showers instead of baths for 2 weeks or as directed by your health care provider. °· Do not drive for 24 hours or as directed. °· Do not drink alcohol while taking pain medicine. °· Do not use tampons, douche, or have sexual intercourse for 2 weeks or until your health care provider says it is okay. °· Take your temperature twice a day for 4-5 days. Write it down each time. °· Follow your health care provider's advice about diet, exercise, and lifting. °· If you develop constipation, you may: °¨ Take a mild laxative if your health care provider approves. °¨ Add bran foods to your diet. °¨ Drink enough fluids to keep your urine clear or pale yellow. °· Try to have someone with you or available to you for the first 24-48 hours, especially if you were given a general anesthetic. °· Follow up with your health care provider as directed. °SEEK MEDICAL CARE IF: °· You feel dizzy or lightheaded. °· You feel sick to your stomach (nauseous). °· You have abnormal vaginal discharge. °· You  have a rash. °· You have pain that is not controlled with medicine. °SEEK IMMEDIATE MEDICAL CARE IF: °· You have bleeding that is heavier than a normal menstrual period. °· You have a fever. °· You have increasing cramps or pain, not controlled with medicine. °· You have new belly (abdominal) pain. °· You pass out. °· You have pain in the tops of your shoulders (shoulder strap areas). °· You have shortness of breath. °Document Released: 01/24/2013 Document Reviewed: 01/24/2013 °[employer]® Patient Information ©2015 [employer]. This information is not intended to replace advice given to you by your health care provider. Make sure you discuss any questions you have with your health care provider. ° °

## 2014-03-01 NOTE — Anesthesia Postprocedure Evaluation (Signed)
  Anesthesia Post-op Note  Anesthesia Post Note  Patient: Jenny Colon  Procedure(s) Performed: Procedure(s) (LRB): DILATATION & CURETTAGE/HYSTEROSCOPY WITH VERSAPOINT RESECTION (N/A)  Anesthesia type: General  Patient location: PACU  Post pain: Pain level controlled  Post assessment: Post-op Vital signs reviewed  Last Vitals:  Filed Vitals:   03/01/14 1645  BP:   Pulse:   Temp:   Resp: 14    Post vital signs: Reviewed  Level of consciousness: sedated  Complications: No apparent anesthesia complications

## 2014-03-01 NOTE — Transfer of Care (Signed)
Immediate Anesthesia Transfer of Care Note  Patient: Jenny Colon  Procedure(s) Performed: Procedure(s): DILATATION & CURETTAGE/HYSTEROSCOPY WITH VERSAPOINT RESECTION (N/A)  Patient Location: PACU  Anesthesia Type:General  Level of Consciousness: awake  Airway & Oxygen Therapy: Patient Spontanous Breathing and Patient connected to nasal cannula oxygen  Post-op Assessment: Report given to PACU RN and Post -op Vital signs reviewed and stable  Post vital signs: stable  Complications: No apparent anesthesia complications

## 2014-03-01 NOTE — Op Note (Signed)
03/01/2014  3:53 PM  PATIENT:  Jenny Colon  48 y.o. female  PRE-OPERATIVE DIAGNOSIS:  Postmenopausal Bleeding, Thickened Endometrium  POST-OPERATIVE DIAGNOSIS:  Postmenopausal Bleeding Thickened Endometrium  PROCEDURE:  Procedure(s): DILATATION & CURETTAGE/HYSTEROSCOPY WITH VERSAPOINT RESECTION  SURGEON:  Surgeon(s): Princess Bruins, MD  ASSISTANTS: none   ANESTHESIA:   general   PROCEDURE:  Under general anesthesia with endotracheal intubation, the patient is an lithotomy position.  She is prepped with Betadine on the suprapubic, vulvar and vaginal areas.  She is draped as usual. Bladder was catheterized. The vaginal exam reveals an anteverted uterus, irregular due to fibroids and mildly increased in size.  No adnexal masses are felt.  The speculum is inserted into the vagina. The anterior lip of the cervix is grasped with a tenaculum.  A paracervical block is done with Nesacaine 1% a total of 20 cc at 4 and 8:00.  The hysterometry is 10 cm.Dilation of the cervix with Hegar dilators up to #31 without difficulty.  The hysteroscope with the VersaPoint loop is inserted in the intrauterine cavity.  Both ostia are visualized. The cavity appears narrow but otherwise normal, except for a focal area of endometrial thickening anteriorly.  This area is resected with the VersaPoint.  We then removed the hysteroscope and proceed with a curettage of the intrauterine cavity on all surfaces with a sharp curette.  The specimens are sent together to pathology.  We could back with the hysteroscope and confirmed good hemostasis. Pictures were taken of the antra uterine cavity before and after resection and curettage.  The hysteroscope is removed.  The tenaculum is removed from the cervix.  Hemostasis is adequate at that level as well. The speculum is removed. The patient is brought to recovery room in good and stable status.    ESTIMATED BLOOD LOSS:  10 cc FLUID DEFICIT:  500 cc   Intake/Output Summary  (Last 24 hours) at 03/01/14 1553 Last data filed at 03/01/14 1550  Gross per 24 hour  Intake   1000 ml  Output     60 ml  Net    940 ml     BLOOD ADMINISTERED:none   LOCAL MEDICATIONS USED:  Nesacaine 1% 20 cc  SPECIMEN:  Source of Specimen:  Endometrial curettings and resection  DISPOSITION OF SPECIMEN:  PATHOLOGY  COUNTS:  YES  PLAN OF CARE: Transfer to PACU  Princess Bruins MD 03/01/2014 at 3:54 pm

## 2014-03-01 NOTE — Discharge Summary (Signed)
  Physician Discharge Summary  Patient ID: CRISTAN HOUT MRN: [id number] DOB/AGE: January 05, 1966 48 y.o.  Admit date: 03/01/2014 Discharge date: 03/01/2014  Admission Diagnoses: Postmenopausal Bleeding, Thickened Endometrium  Discharge Diagnoses: Postmenopausal Bleeding, Thickened Endometrium        Active Problems:   * No active hospital problems. *   Discharged Condition: good  Hospital Course: Outpatient  Consults: None  Treatments: surgery: Hysteroscopy with Versapoint resection and D+C  Disposition: 01-Home or Self Care     Medication List    STOP taking these medications        telmisartan-hydrochlorothiazide 80-25 MG per tablet  Commonly known as:  MICARDIS HCT     triamcinolone ointment 0.1 %  Commonly known as:  KENALOG      TAKE these medications        Fish Oil 300 MG Caps  Take 1 capsule by mouth 2 (two) times daily.     glipiZIDE 5 MG 24 hr tablet  Commonly known as:  GLUCOTROL XL  Take 1 tablet (5 mg total) by mouth daily.     hydrochlorothiazide 25 MG tablet  Commonly known as:  HYDRODIURIL  Take 25 mg by mouth daily.     hydrocodone-ibuprofen 5-200 MG per tablet  Commonly known as:  VICOPROFEN  Take 1 tablet by mouth every 8 (eight) hours as needed for pain.     lisinopril 40 MG tablet  Commonly known as:  PRINIVIL,ZESTRIL  Take 1 tablet (40 mg total) by mouth daily.     metFORMIN 500 MG tablet  Commonly known as:  GLUCOPHAGE  Take 1 tablet (500 mg total) by mouth 2 (two) times daily with a meal.     PRESCRIPTION MEDICATION  Apply 1 application topically 2 (two) times daily. Eczema cream - not on record with the pharmacy     rosuvastatin 10 MG tablet  Commonly known as:  CRESTOR  Take 1 tablet (10 mg total) by mouth daily.     vitamin B-12 50 MCG tablet  Commonly known as:  CYANOCOBALAMIN  Take 50 mcg by mouth daily.     Vitamin D-3 5000 UNITS Tabs  Take 1 tablet by mouth daily.           Follow-up Information    Follow up  with Lyfe Monger,MARIE-LYNE, MD In 3 weeks.   Specialty:  Obstetrics and Gynecology   Contact information:   [place] [address] [phone]       Signed: Princess Bruins, MD 03/01/2014, 4:07 PM

## 2014-03-04 ENCOUNTER — Encounter (HOSPITAL_COMMUNITY): Payer: Self-pay | Admitting: Obstetrics & Gynecology

## 2014-03-22 ENCOUNTER — Emergency Department (HOSPITAL_COMMUNITY): Payer: BC Managed Care – PPO

## 2014-03-22 ENCOUNTER — Emergency Department (HOSPITAL_COMMUNITY)
Admission: EM | Admit: 2014-03-22 | Discharge: 2014-03-22 | Disposition: A | Discharge status: Home / Self Care | Payer: BC Managed Care – PPO | Attending: Emergency Medicine | Admitting: Emergency Medicine

## 2014-03-22 ENCOUNTER — Encounter (HOSPITAL_COMMUNITY): Payer: Self-pay | Admitting: Emergency Medicine

## 2014-03-22 DIAGNOSIS — E119 Type 2 diabetes mellitus without complications: Secondary | ICD-10-CM | POA: Insufficient documentation

## 2014-03-22 DIAGNOSIS — Z872 Personal history of diseases of the skin and subcutaneous tissue: Secondary | ICD-10-CM | POA: Insufficient documentation

## 2014-03-22 DIAGNOSIS — S20219A Contusion of unspecified front wall of thorax, initial encounter: Secondary | ICD-10-CM | POA: Insufficient documentation

## 2014-03-22 DIAGNOSIS — I10 Essential (primary) hypertension: Secondary | ICD-10-CM | POA: Insufficient documentation

## 2014-03-22 DIAGNOSIS — E780 Pure hypercholesterolemia: Secondary | ICD-10-CM | POA: Insufficient documentation

## 2014-03-22 DIAGNOSIS — Y92410 Unspecified street and highway as the place of occurrence of the external cause: Secondary | ICD-10-CM | POA: Insufficient documentation

## 2014-03-22 DIAGNOSIS — Y998 Other external cause status: Secondary | ICD-10-CM | POA: Diagnosis not present

## 2014-03-22 DIAGNOSIS — S299XXA Unspecified injury of thorax, initial encounter: Secondary | ICD-10-CM | POA: Diagnosis present

## 2014-03-22 DIAGNOSIS — Y9389 Activity, other specified: Secondary | ICD-10-CM | POA: Diagnosis not present

## 2014-03-22 DIAGNOSIS — Z8719 Personal history of other diseases of the digestive system: Secondary | ICD-10-CM | POA: Diagnosis not present

## 2014-03-22 DIAGNOSIS — Z880 Allergy status to penicillin: Secondary | ICD-10-CM | POA: Insufficient documentation

## 2014-03-22 DIAGNOSIS — Z79899 Other long term (current) drug therapy: Secondary | ICD-10-CM | POA: Diagnosis not present

## 2014-03-22 MED ORDER — OXYCODONE-ACETAMINOPHEN 5-325 MG PO TABS: 2.0000 | ORAL_TABLET | Freq: Once | ORAL | Status: DC

## 2014-03-22 MED ORDER — HYDROCODONE-ACETAMINOPHEN 5-325 MG PO TABS: 2.0000 | ORAL_TABLET | ORAL | Status: DC | PRN

## 2014-03-22 NOTE — ED Notes (Signed)
Pt c/o L chest wall pain that only hurts when taking deep breaths or palpating. Pt denies hitting head, LOC or other symptoms. A&Ox4. Ambulatory in triage room.
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
## 2014-04-19 HISTORY — PX: BREAST BIOPSY: SHX20

## 2014-06-26 ENCOUNTER — Other Ambulatory Visit: Payer: Self-pay | Admitting: Family Medicine

## 2014-06-26 DIAGNOSIS — R4702 Dysphasia: Secondary | ICD-10-CM

## 2014-06-28 ENCOUNTER — Ambulatory Visit
Admission: RE | Admit: 2014-06-28 | Discharge: 2014-06-28 | Disposition: A | Discharge status: Home / Self Care | Payer: 59 | Source: Ambulatory Visit | Attending: Family Medicine | Admitting: Family Medicine

## 2014-06-28 DIAGNOSIS — R4702 Dysphasia: Secondary | ICD-10-CM

## 2014-08-27 ENCOUNTER — Other Ambulatory Visit: Payer: Self-pay

## 2014-08-27 DIAGNOSIS — Z1231 Encounter for screening mammogram for malignant neoplasm of breast: Secondary | ICD-10-CM

## 2014-09-18 ENCOUNTER — Ambulatory Visit: Payer: 59

## 2014-10-17 ENCOUNTER — Ambulatory Visit
Admission: RE | Admit: 2014-10-17 | Discharge: 2014-10-17 | Disposition: A | Discharge status: Home / Self Care | Payer: 59 | Source: Ambulatory Visit

## 2014-10-17 DIAGNOSIS — Z1231 Encounter for screening mammogram for malignant neoplasm of breast: Secondary | ICD-10-CM

## 2015-11-04 ENCOUNTER — Other Ambulatory Visit: Payer: Self-pay | Admitting: Family Medicine

## 2015-11-04 DIAGNOSIS — Z1231 Encounter for screening mammogram for malignant neoplasm of breast: Secondary | ICD-10-CM

## 2015-11-20 ENCOUNTER — Ambulatory Visit
Admission: RE | Admit: 2015-11-20 | Discharge: 2015-11-20 | Disposition: A | Discharge status: Home / Self Care | Payer: BLUE CROSS/BLUE SHIELD | Source: Ambulatory Visit | Attending: Family Medicine | Admitting: Family Medicine

## 2015-11-20 DIAGNOSIS — Z1231 Encounter for screening mammogram for malignant neoplasm of breast: Secondary | ICD-10-CM

## 2015-11-30 ENCOUNTER — Encounter (HOSPITAL_COMMUNITY): Payer: Self-pay | Admitting: Emergency Medicine

## 2015-11-30 ENCOUNTER — Emergency Department (HOSPITAL_COMMUNITY)
Admission: EM | Admit: 2015-11-30 | Discharge: 2015-11-30 | Disposition: A | Discharge status: Home / Self Care | Payer: BLUE CROSS/BLUE SHIELD | Attending: Emergency Medicine | Admitting: Emergency Medicine

## 2015-11-30 DIAGNOSIS — T310 Burns involving less than 10% of body surface: Secondary | ICD-10-CM

## 2015-11-30 DIAGNOSIS — Y999 Unspecified external cause status: Secondary | ICD-10-CM | POA: Insufficient documentation

## 2015-11-30 DIAGNOSIS — Z7984 Long term (current) use of oral hypoglycemic drugs: Secondary | ICD-10-CM | POA: Diagnosis not present

## 2015-11-30 DIAGNOSIS — Y93G2 Activity, grilling and smoking food: Secondary | ICD-10-CM | POA: Insufficient documentation

## 2015-11-30 DIAGNOSIS — Z23 Encounter for immunization: Secondary | ICD-10-CM | POA: Diagnosis not present

## 2015-11-30 DIAGNOSIS — T22131A Burn of first degree of right upper arm, initial encounter: Secondary | ICD-10-CM | POA: Diagnosis not present

## 2015-11-30 DIAGNOSIS — T22111A Burn of first degree of right forearm, initial encounter: Secondary | ICD-10-CM | POA: Diagnosis not present

## 2015-11-30 DIAGNOSIS — Z79899 Other long term (current) drug therapy: Secondary | ICD-10-CM | POA: Diagnosis not present

## 2015-11-30 DIAGNOSIS — Z7982 Long term (current) use of aspirin: Secondary | ICD-10-CM | POA: Insufficient documentation

## 2015-11-30 DIAGNOSIS — X19XXXA Contact with other heat and hot substances, initial encounter: Secondary | ICD-10-CM | POA: Diagnosis not present

## 2015-11-30 DIAGNOSIS — E119 Type 2 diabetes mellitus without complications: Secondary | ICD-10-CM | POA: Insufficient documentation

## 2015-11-30 DIAGNOSIS — T2010XA Burn of first degree of head, face, and neck, unspecified site, initial encounter: Secondary | ICD-10-CM | POA: Diagnosis present

## 2015-11-30 DIAGNOSIS — T2111XA Burn of first degree of chest wall, initial encounter: Secondary | ICD-10-CM | POA: Insufficient documentation

## 2015-11-30 DIAGNOSIS — Y929 Unspecified place or not applicable: Secondary | ICD-10-CM | POA: Diagnosis not present

## 2015-11-30 DIAGNOSIS — I10 Essential (primary) hypertension: Secondary | ICD-10-CM | POA: Insufficient documentation

## 2015-11-30 MED ORDER — BACITRACIN ZINC 500 UNIT/GM EX OINT
TOPICAL_OINTMENT | CUTANEOUS | Status: AC
  Filled 2015-11-30: qty 0.9

## 2015-11-30 MED ORDER — BACITRACIN ZINC 500 UNIT/GM EX OINT
1.0000 "application " | TOPICAL_OINTMENT | Freq: Once | CUTANEOUS | Status: AC
  Administered 2015-11-30: 1 via TOPICAL
  Filled 2015-11-30: qty 0.9

## 2015-11-30 MED ORDER — TETANUS-DIPHTH-ACELL PERTUSSIS 5-2.5-18.5 LF-MCG/0.5 IM SUSP
0.5000 mL | Freq: Once | INTRAMUSCULAR | Status: AC
  Administered 2015-11-30: 0.5 mL via INTRAMUSCULAR
  Filled 2015-11-30: qty 0.5

## 2015-11-30 NOTE — Discharge Instructions (Signed)
Please return immediately if you have any difficulty breathing or any signs of skin infection.
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
## 2016-12-22 ENCOUNTER — Other Ambulatory Visit: Payer: Self-pay | Admitting: Family Medicine

## 2016-12-22 DIAGNOSIS — Z1231 Encounter for screening mammogram for malignant neoplasm of breast: Secondary | ICD-10-CM

## 2016-12-30 ENCOUNTER — Ambulatory Visit
Admission: RE | Admit: 2016-12-30 | Discharge: 2016-12-30 | Disposition: A | Discharge status: Home / Self Care | Payer: BLUE CROSS/BLUE SHIELD | Source: Ambulatory Visit | Attending: Family Medicine | Admitting: Family Medicine

## 2016-12-30 DIAGNOSIS — Z1231 Encounter for screening mammogram for malignant neoplasm of breast: Secondary | ICD-10-CM

## 2016-12-31 ENCOUNTER — Other Ambulatory Visit: Payer: Self-pay | Admitting: Family Medicine

## 2016-12-31 DIAGNOSIS — R928 Other abnormal and inconclusive findings on diagnostic imaging of breast: Secondary | ICD-10-CM

## 2017-01-06 ENCOUNTER — Ambulatory Visit
Admission: RE | Admit: 2017-01-06 | Discharge: 2017-01-06 | Disposition: A | Discharge status: Home / Self Care | Payer: BLUE CROSS/BLUE SHIELD | Source: Ambulatory Visit | Attending: Family Medicine | Admitting: Family Medicine

## 2017-01-06 ENCOUNTER — Other Ambulatory Visit: Payer: Self-pay | Admitting: Family Medicine

## 2017-01-06 DIAGNOSIS — R928 Other abnormal and inconclusive findings on diagnostic imaging of breast: Secondary | ICD-10-CM

## 2017-02-23 ENCOUNTER — Encounter (HOSPITAL_COMMUNITY): Payer: Self-pay

## 2017-07-05 ENCOUNTER — Ambulatory Visit: Payer: BLUE CROSS/BLUE SHIELD

## 2017-07-05 ENCOUNTER — Ambulatory Visit
Admission: RE | Admit: 2017-07-05 | Discharge: 2017-07-05 | Disposition: A | Discharge status: Home / Self Care | Payer: BLUE CROSS/BLUE SHIELD | Source: Ambulatory Visit | Attending: Family Medicine | Admitting: Family Medicine

## 2017-07-05 DIAGNOSIS — R928 Other abnormal and inconclusive findings on diagnostic imaging of breast: Secondary | ICD-10-CM

## 2017-07-06 ENCOUNTER — Other Ambulatory Visit: Payer: BLUE CROSS/BLUE SHIELD

## 2018-01-26 ENCOUNTER — Other Ambulatory Visit: Payer: Self-pay | Admitting: Family Medicine

## 2018-01-26 DIAGNOSIS — Z1231 Encounter for screening mammogram for malignant neoplasm of breast: Secondary | ICD-10-CM

## 2018-03-02 ENCOUNTER — Ambulatory Visit
Admission: RE | Admit: 2018-03-02 | Discharge: 2018-03-02 | Disposition: A | Discharge status: Home / Self Care | Payer: BLUE CROSS/BLUE SHIELD | Source: Ambulatory Visit | Attending: Family Medicine | Admitting: Family Medicine

## 2018-03-02 DIAGNOSIS — Z1231 Encounter for screening mammogram for malignant neoplasm of breast: Secondary | ICD-10-CM
